# Patient Record
Sex: Male | Born: 1985 | Race: Black or African American | Hispanic: No | Marital: Married | Smoking: Current every day smoker
Health system: Southern US, Community
[De-identification: ages and names within clinical notes are randomized; demographics above are authoritative.]

## PROBLEM LIST (undated history)

## (undated) DIAGNOSIS — E119 Type 2 diabetes mellitus without complications: Secondary | ICD-10-CM

---
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
## 2007-03-26 IMAGING — CR DG ORBITS FOR FOREIGN BODY
2 series · 2 of 2 positions shown · non-contrast
Comparison: none

CLINICAL DATA: The patient has worked around metal.  Pre MRI.
 VIEWS OF THE ORBITS FOR FOREIGN BODY.
 Views of the orbits were obtained with the patient looking to the left and looking to the right.  No orbital metallic foreign body is seen.  No bony abnormality is noted.

[view not recorded (1 of 2)]
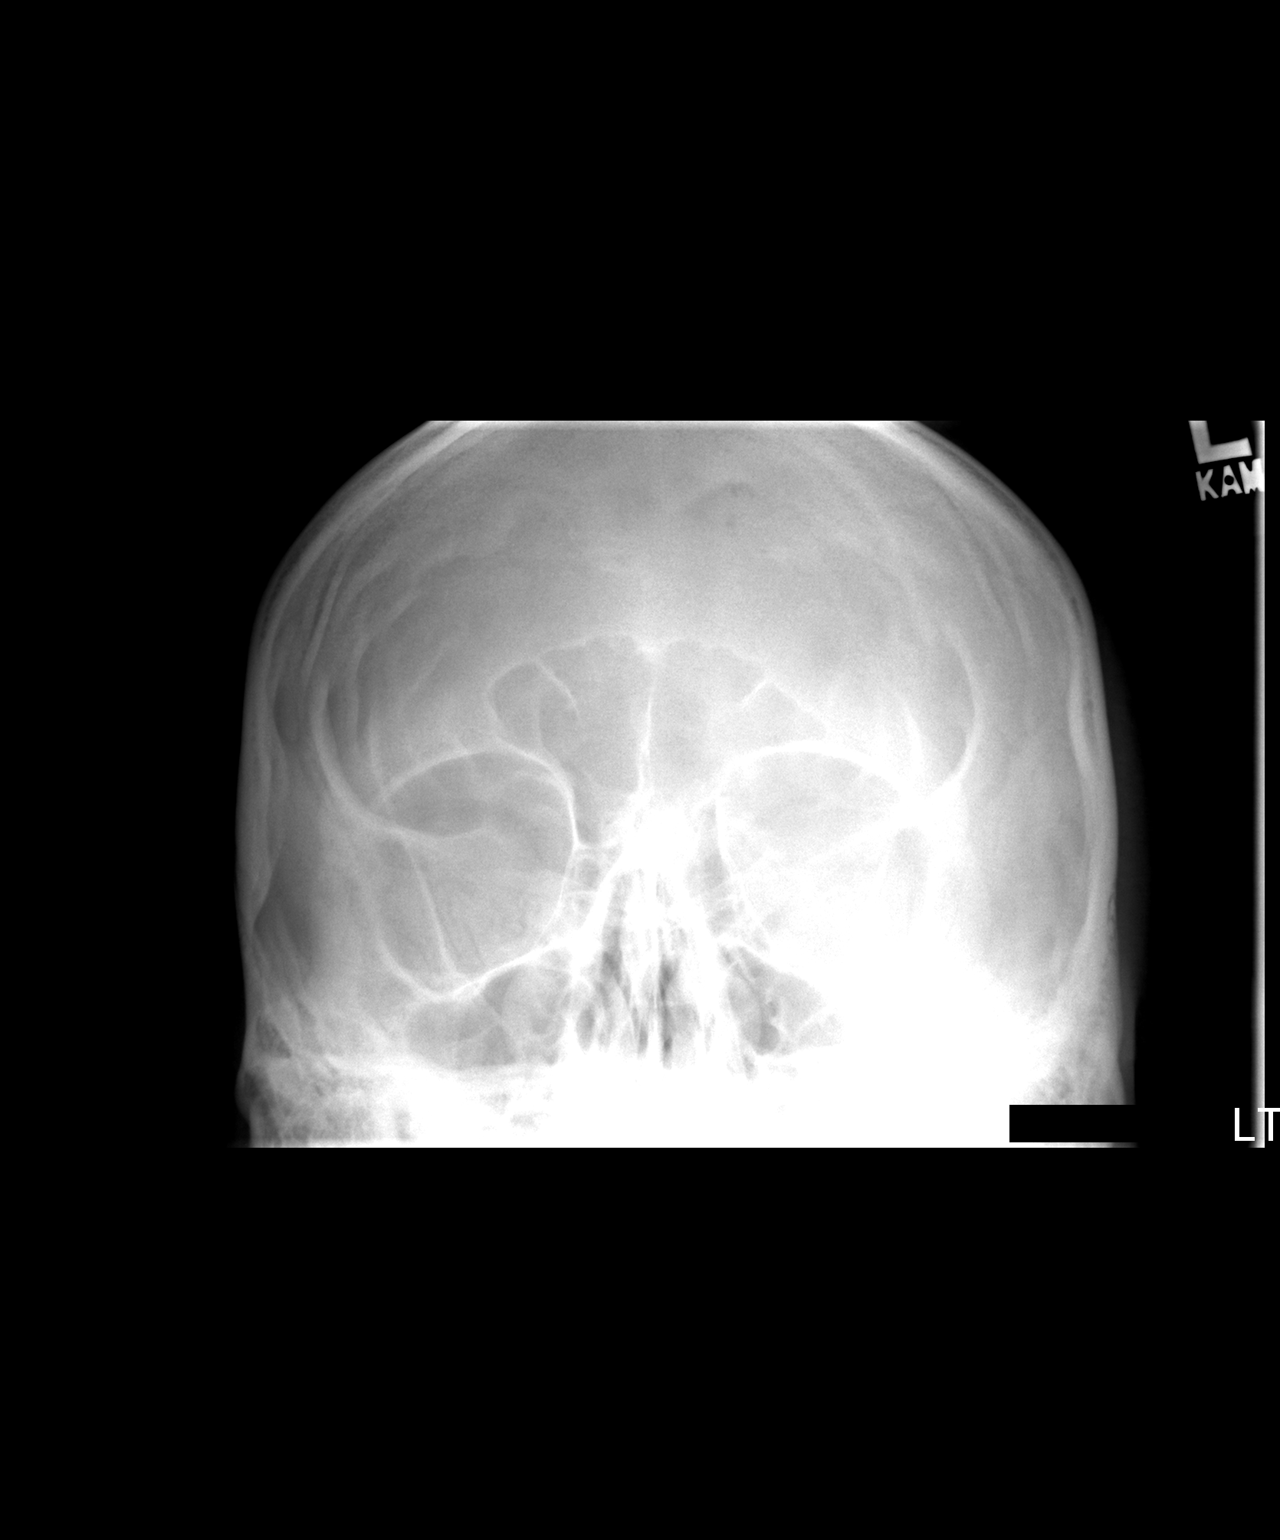

[view not recorded (2 of 2)]
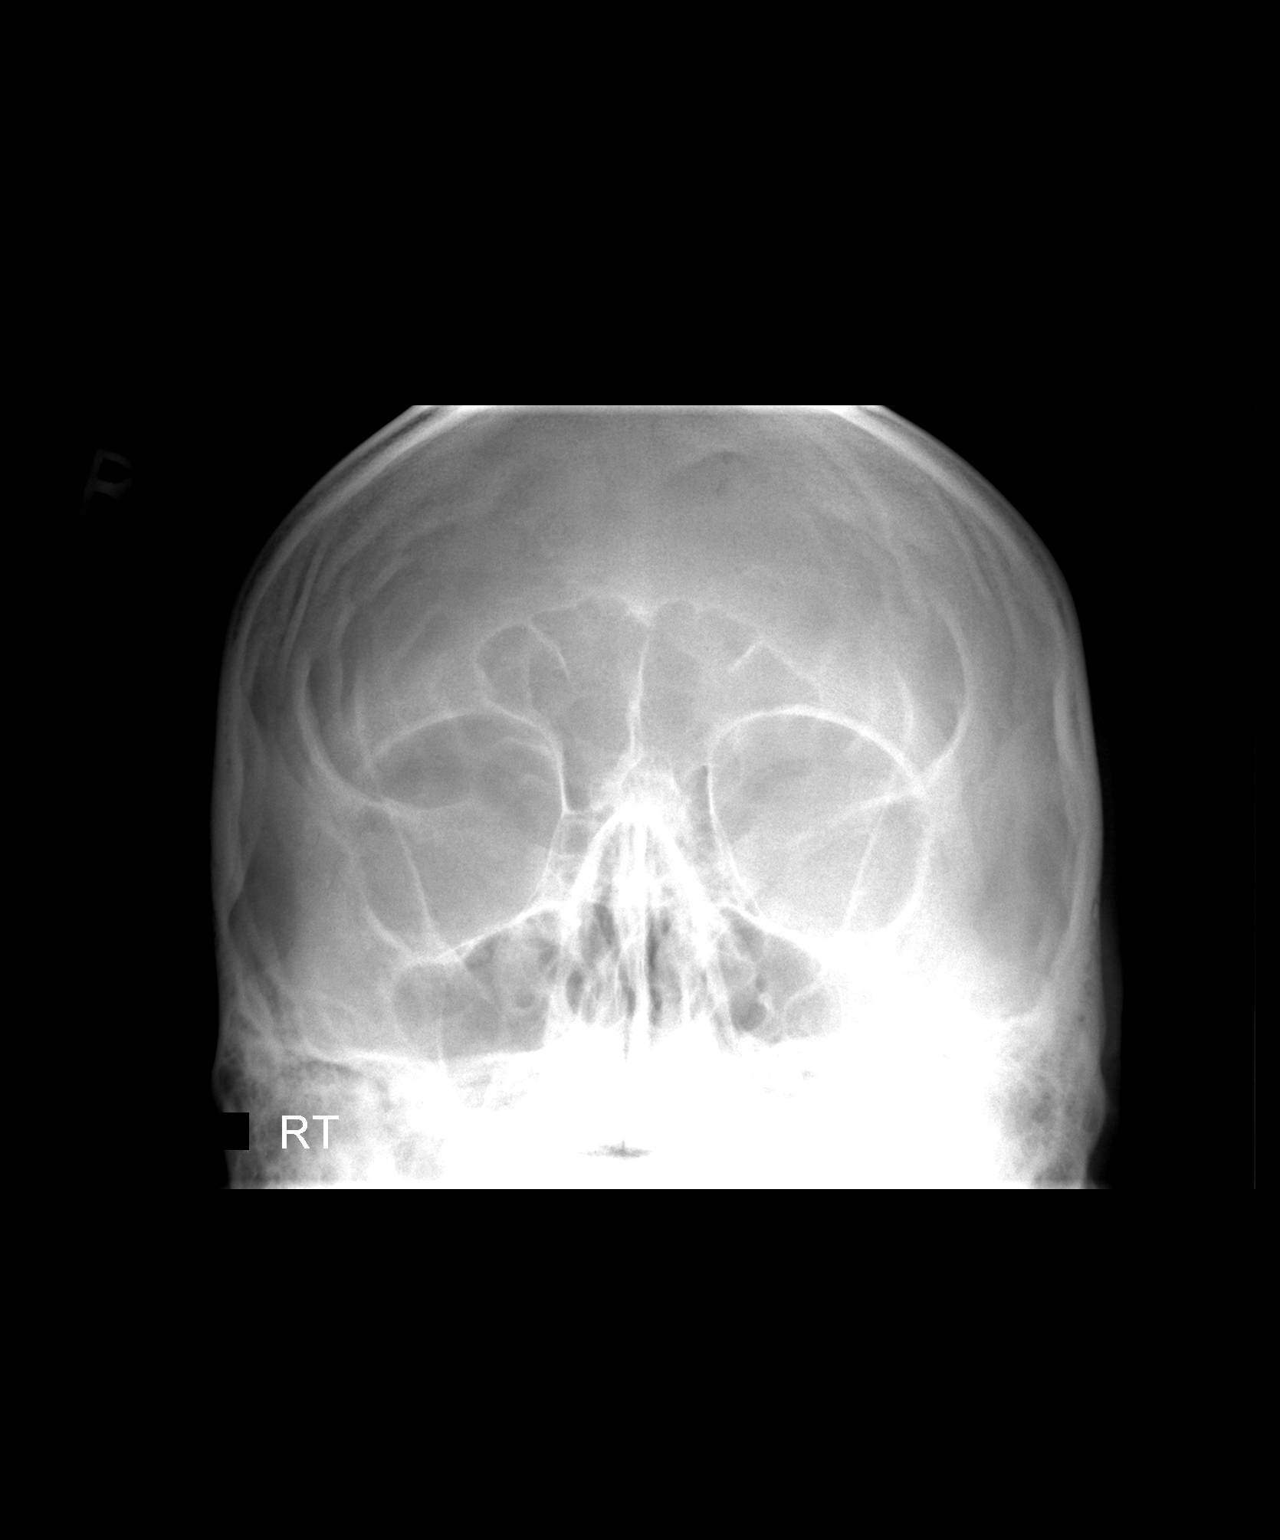

[2 of 2 positions shown; findings below may reference images not displayed]

IMPRESSION: No orbital metallic foreign body.

## 2018-11-07 ENCOUNTER — Encounter (HOSPITAL_COMMUNITY): Payer: Self-pay | Admitting: Emergency Medicine

## 2018-11-07 ENCOUNTER — Emergency Department (HOSPITAL_COMMUNITY)
Admission: EM | Admit: 2018-11-07 | Discharge: 2018-11-07 | Disposition: A | Discharge status: Home / Self Care | Payer: BC Managed Care – PPO | Attending: Emergency Medicine | Admitting: Emergency Medicine

## 2018-11-07 ENCOUNTER — Other Ambulatory Visit: Payer: Self-pay

## 2018-11-07 DIAGNOSIS — R739 Hyperglycemia, unspecified: Secondary | ICD-10-CM | POA: Diagnosis not present

## 2018-11-07 DIAGNOSIS — R531 Weakness: Secondary | ICD-10-CM | POA: Diagnosis present

## 2018-11-07 LAB — COMPREHENSIVE METABOLIC PANEL
ALT: 39 U/L (ref 0–44)
AST: 30 U/L (ref 15–41)
Albumin: 4.7 g/dL (ref 3.5–5.0)
Alkaline Phosphatase: 62 U/L (ref 38–126)
Anion gap: 17 — ABNORMAL HIGH (ref 5–15)
BUN: 22 mg/dL — ABNORMAL HIGH (ref 6–20)
CO2: 17 mmol/L — ABNORMAL LOW (ref 22–32)
Calcium: 9.8 mg/dL (ref 8.9–10.3)
Chloride: 94 mmol/L — ABNORMAL LOW (ref 98–111)
Creatinine, Ser: 1.47 mg/dL — ABNORMAL HIGH (ref 0.61–1.24)
GFR calc Af Amer: >60 mL/min (ref >60)
GFR calc non Af Amer: >60 mL/min (ref >60)
Glucose, Bld: 431 mg/dL — ABNORMAL HIGH (ref 70–99)
Potassium: 4.6 mmol/L (ref 3.5–5.1)
Sodium: 128 mmol/L — ABNORMAL LOW (ref 135–145)
Total Bilirubin: 1.4 mg/dL — ABNORMAL HIGH (ref 0.3–1.2)
Total Protein: 8.2 g/dL — ABNORMAL HIGH (ref 6.5–8.1)

## 2018-11-07 LAB — CBC WITH DIFFERENTIAL/PLATELET
Abs Immature Granulocytes: 0.02 10*3/uL (ref 0.00–0.07)
Basophils Absolute: 0 10*3/uL (ref 0.0–0.1)
Basophils Relative: 1 %
Eosinophils Absolute: 0.1 10*3/uL (ref 0.0–0.5)
Eosinophils Relative: 1 %
HCT: 47.5 % (ref 39.0–52.0)
Hemoglobin: 16.3 g/dL (ref 13.0–17.0)
Immature Granulocytes: 0 %
Lymphocytes Relative: 26 %
Lymphs Abs: 2 10*3/uL (ref 0.7–4.0)
MCH: 27.9 pg (ref 26.0–34.0)
MCHC: 34.3 g/dL (ref 30.0–36.0)
MCV: 81.2 fL (ref 80.0–100.0)
Monocytes Absolute: 0.5 10*3/uL (ref 0.1–1.0)
Monocytes Relative: 6 %
Neutro Abs: 5.3 10*3/uL (ref 1.7–7.7)
Neutrophils Relative %: 66 %
Platelets: 259 10*3/uL (ref 150–400)
RBC: 5.85 MIL/uL — ABNORMAL HIGH (ref 4.22–5.81)
RDW: 12 % (ref 11.5–15.5)
WBC: 7.9 10*3/uL (ref 4.0–10.5)
nRBC: 0 % (ref 0.0–0.2)

## 2018-11-07 LAB — URINALYSIS, ROUTINE W REFLEX MICROSCOPIC
Bacteria, UA: NONE SEEN
Bilirubin Urine: NEGATIVE
Glucose, UA: >=500 mg/dL — AB
Hgb urine dipstick: NEGATIVE
Ketones, ur: 80 mg/dL — AB
Leukocytes,Ua: NEGATIVE
Nitrite: NEGATIVE
Protein, ur: NEGATIVE mg/dL
Specific Gravity, Urine: 1.03 (ref 1.005–1.030)
pH: 5 (ref 5.0–8.0)

## 2018-11-07 LAB — CBG MONITORING, ED
Glucose-Capillary: 443 mg/dL — ABNORMAL HIGH (ref 70–99)
Glucose-Capillary: 290 mg/dL — ABNORMAL HIGH (ref 70–99)

## 2018-11-07 LAB — BASIC METABOLIC PANEL
Anion gap: 13 (ref 5–15)
BUN: 18 mg/dL (ref 6–20)
CO2: 17 mmol/L — ABNORMAL LOW (ref 22–32)
Calcium: 8.7 mg/dL — ABNORMAL LOW (ref 8.9–10.3)
Chloride: 103 mmol/L (ref 98–111)
Creatinine, Ser: 1.26 mg/dL — ABNORMAL HIGH (ref 0.61–1.24)
GFR calc Af Amer: >60 mL/min (ref >60)
GFR calc non Af Amer: >60 mL/min (ref >60)
Glucose, Bld: 274 mg/dL — ABNORMAL HIGH (ref 70–99)
Potassium: 3.9 mmol/L (ref 3.5–5.1)
Sodium: 133 mmol/L — ABNORMAL LOW (ref 135–145)

## 2018-11-07 MED ORDER — INSULIN ASPART 100 UNIT/ML ~~LOC~~ SOLN
6.0000 [IU] | Freq: Once | SUBCUTANEOUS | Status: AC
  Administered 2018-11-07: 16:00:00 6 [IU] via SUBCUTANEOUS
  Filled 2018-11-07: qty 1

## 2018-11-07 MED ORDER — SODIUM CHLORIDE 0.9 % IV BOLUS
1000.0000 mL | Freq: Once | INTRAVENOUS | Status: AC
  Administered 2018-11-07: 17:00:00 1000 mL via INTRAVENOUS

## 2018-11-07 MED ORDER — METFORMIN HCL 500 MG PO TABS: 500.0000 mg | ORAL_TABLET | Freq: Two times a day (BID) | ORAL | 0 refills | Status: DC

## 2018-11-07 MED ORDER — SODIUM CHLORIDE 0.9 % IV BOLUS
1000.0000 mL | Freq: Once | INTRAVENOUS | Status: AC
  Administered 2018-11-07: 1000 mL via INTRAVENOUS

## 2018-11-07 NOTE — Discharge Instructions (Signed)
Continue to drink plenty of water.  Take the metformin as directed twice daily.  Follow-up with your doctor next week.  Return for any worsening symptoms

## 2018-11-07 NOTE — ED Provider Notes (Signed)
[facility] EMERGENCY DEPARTMENT Provider Note   CSN: [id number] Arrival date & time: 11/07/18  1415    History   Chief Complaint Chief Complaint  Patient presents with  . Hyperglycemia    HPI Jose Bailey is a 33 y.o. male with a past medical history of pre diabetes, followed with A1C's by his pcp and low testosterone, presenting with an approximate 10 day history of generalized weakness along with increased urinary frequency without dysuria or cloudy urine and increased thirst. He denies fevers or chills, has no nausea, vomiting or abdominal pain.  He has had no recent illnesses, cough, also denies shortness of breath or chest pain.  Given his strong family history of diabetes and known prediabetic he purchased a glucose meter prior to arriving here with results of almost 500.  He has had no treatments prior to arrival.  PCP is [facility] [facility] in [place].     The history is provided by the patient.    History reviewed. No pertinent past medical history.  There are no active problems to display for this patient.   History reviewed. No pertinent surgical history.      Home Medications    Prior to Admission medications   Medication Sig Start Date End Date Taking? Authorizing Provider  cyanocobalamin (,VITAMIN B-12,) 1000 MCG/ML injection Inject 1 mL into the skin every 30 (thirty) days. 07/10/17  Yes [provider]  clomiPHENE (CLOMID) 50 MG tablet Take 1 tablet by mouth daily. 08/03/18   [provider]    Family History History reviewed. No pertinent family history.  Social History Social History   Tobacco Use  . Smoking status: Current Every Day Smoker    Types: Cigars  . Smokeless tobacco: Never Used  . Tobacco comment: monthly  Substance Use Topics  . Alcohol use: Yes    Comment: weekends  . Drug use: Never     Allergies   Penicillins   Review of Systems Review of Systems  Constitutional: Positive for fatigue. Negative for  chills and fever.  HENT: Negative for congestion and sore throat.   Eyes: Negative.   Respiratory: Negative for chest tightness and shortness of breath.   Cardiovascular: Negative for chest pain and palpitations.  Gastrointestinal: Negative for abdominal pain, nausea and vomiting.  Endocrine: Positive for polydipsia and polyuria.  Genitourinary: Negative.   Musculoskeletal: Negative for arthralgias, joint swelling and neck pain.  Skin: Negative.  Negative for rash and wound.  Neurological: Negative for dizziness, weakness, light-headedness, numbness and headaches.  Psychiatric/Behavioral: Negative.      Physical Exam Updated Vital Signs BP (!) 129/94 Comment: Simultaneous filing. User may not have seen previous data.  Pulse 81 Comment: Simultaneous filing. User may not have seen previous data.  Temp 98 F (36.7 C) (Oral)   Resp 18   Ht 5\' 8"  (1.727 m)   Wt 105.7 kg   SpO2 98% Comment: Simultaneous filing. User may not have seen previous data.  BMI 35.43 kg/m   Physical Exam Vitals signs and nursing note reviewed.  Constitutional:      Appearance: He is well-developed.  HENT:     Head: Normocephalic and atraumatic.     Mouth/Throat:     Mouth: Mucous membranes are dry.     Pharynx: Oropharynx is clear.  Eyes:     Conjunctiva/sclera: Conjunctivae normal.  Neck:     Musculoskeletal: Normal range of motion.  Cardiovascular:     Rate and Rhythm: Normal rate and regular rhythm.  Heart sounds: Normal heart sounds.  Pulmonary:     Effort: Pulmonary effort is normal.     Breath sounds: Normal breath sounds. No wheezing.  Abdominal:     General: Bowel sounds are normal. There is no distension.     Palpations: Abdomen is soft.     Tenderness: There is no abdominal tenderness. There is no guarding.  Musculoskeletal: Normal range of motion.     Right lower leg: No edema.     Left lower leg: No edema.  Skin:    General: Skin is warm and dry.  Neurological:     Mental  Status: He is alert.      ED Treatments / Results  Labs (all labs ordered are listed, but only abnormal results are displayed) Labs Reviewed  CBC WITH DIFFERENTIAL/PLATELET - Abnormal; Notable for the following components:      Result Value   RBC 5.85 (*)    All other components within normal limits  COMPREHENSIVE METABOLIC PANEL - Abnormal; Notable for the following components:   Sodium 128 (*)    Chloride 94 (*)    CO2 17 (*)    Glucose, Bld 431 (*)    BUN 22 (*)    Creatinine, Ser 1.47 (*)    Total Protein 8.2 (*)    Total Bilirubin 1.4 (*)    Anion gap 17 (*)    All other components within normal limits  URINALYSIS, ROUTINE W REFLEX MICROSCOPIC - Abnormal; Notable for the following components:   Color, Urine STRAW (*)    Glucose, UA >=500 (*)    Ketones, ur 80 (*)    All other components within normal limits  CBG MONITORING, ED - Abnormal; Notable for the following components:   Glucose-Capillary 443 (*)    All other components within normal limits    EKG None  Radiology No results found.  Procedures Procedures (including critical care time)  Medications Ordered in ED Medications  sodium chloride 0.9 % bolus 1,000 mL (has no administration in time range)  sodium chloride 0.9 % bolus 1,000 mL (1,000 mLs Intravenous New Bag/Given 11/07/18 1529)  insulin aspart (novoLOG) injection 6 Units (6 Units Subcutaneous Given 11/07/18 1536)     Initial Impression / Assessment and Plan / ED Course  I have reviewed the triage vital signs and the nursing notes.  Pertinent labs & imaging results that were available during my care of the patient were reviewed by me and considered in my medical decision making (see chart for details).        Patient who is known to be prediabetic and now with full on diabetes.  His labs are significant for a blood glucose level of 431.  He is also dry with a BUN of 22 and a creatinine of 1.47.  His corrected sodium is 133.  He was given IV  fluids along with a subcu dose of fast acting insulin 6 units.  He does have a small anion gap at 17.  Plan will be to continue hydration, recheck electrolytes including serial CBGs.  He does not clinically appear to have DKA and may be discharged home with plan to start oral hypoglycemics.  Discussed with Pauline Ausammy Triplett, PA-C who assumes care of patient.  Final Clinical Impressions(s) / ED Diagnoses   Final diagnoses:  Hyperglycemia    ED Discharge Orders    None       Victoriano Laindol, Latanza Pfefferkorn, PA-C 11/07/18 1620    Sabas SousBero, Michael M, MD 11/12/18 [phone]

## 2018-11-07 NOTE — ED Triage Notes (Signed)
Pt reports being borderline diabetic for a while now and for the past week or so has been feeling really bad. Has had to leave work twice due to fatigue. Has been very thirsty with increased urination. Went and bought a glucose meter and it was close to 500.

## 2018-11-07 NOTE — ED Provider Notes (Signed)
Patient signed out to me by Evalee Jefferson, PA-C at end of shift.  Patient is 33 year old male known to be prediabetic.  Came to the emergency department today with complaints of increased thirst, urination and generally not feeling well.  He was found to have a blood sugar of 443.  He was given subcu insulin and IV fluids.  He was signed out to me pending repeat BMP after his second liter of fluids.  On recheck, patient reports feeling much better and states he is ready to go home.  His repeat electrolytes have improved, blood sugar now 274.  He was taken Clomid but has stopped that medication earlier this week.  He has a PCP follow-up on Monday for repeat A1c.  Vitals reviewed, will start patient on oral hypoglycemic and he will follow-up with PCP on Monday.  He agrees to this plan and appears appropriate for discharge home.  Return precautions were discussed.    Labs Reviewed  CBC WITH DIFFERENTIAL/PLATELET - Abnormal; Notable for the following components:      Result Value   RBC 5.85 (*)    All other components within normal limits  COMPREHENSIVE METABOLIC PANEL - Abnormal; Notable for the following components:   Sodium 128 (*)    Chloride 94 (*)    CO2 17 (*)    Glucose, Bld 431 (*)    BUN 22 (*)    Creatinine, Ser 1.47 (*)    Total Protein 8.2 (*)    Total Bilirubin 1.4 (*)    Anion gap 17 (*)    All other components within normal limits  URINALYSIS, ROUTINE W REFLEX MICROSCOPIC - Abnormal; Notable for the following components:   Color, Urine STRAW (*)    Glucose, UA >=500 (*)    Ketones, ur 80 (*)    All other components within normal limits  BASIC METABOLIC PANEL - Abnormal; Notable for the following components:   Sodium 133 (*)    CO2 17 (*)    Glucose, Bld 274 (*)    Creatinine, Ser 1.26 (*)    Calcium 8.7 (*)    All other components within normal limits  CBG MONITORING, ED - Abnormal; Notable for the following components:   Glucose-Capillary 443 (*)    All other  components within normal limits  CBG MONITORING, ED - Abnormal; Notable for the following components:   Glucose-Capillary 290 (*)    All other components within normal limits      Kem Parkinson, PA-C 11/07/18 2009    Varney Biles, MD 11/10/18 2001

## 2018-11-11 ENCOUNTER — Encounter (HOSPITAL_COMMUNITY): Payer: Self-pay | Admitting: Emergency Medicine

## 2018-11-11 ENCOUNTER — Other Ambulatory Visit: Payer: Self-pay

## 2018-11-11 ENCOUNTER — Inpatient Hospital Stay (HOSPITAL_COMMUNITY)
Admission: EM | Admit: 2018-11-11 | Discharge: 2018-11-13 | DRG: 638 | Disposition: A | Discharge status: Home / Self Care | Payer: BC Managed Care – PPO | Attending: Family Medicine | Admitting: Family Medicine

## 2018-11-11 DIAGNOSIS — E1122 Type 2 diabetes mellitus with diabetic chronic kidney disease: Secondary | ICD-10-CM | POA: Diagnosis present

## 2018-11-11 DIAGNOSIS — E871 Hypo-osmolality and hyponatremia: Secondary | ICD-10-CM | POA: Diagnosis present

## 2018-11-11 DIAGNOSIS — Z880 Allergy status to penicillin: Secondary | ICD-10-CM

## 2018-11-11 DIAGNOSIS — N181 Chronic kidney disease, stage 1: Secondary | ICD-10-CM | POA: Diagnosis not present

## 2018-11-11 DIAGNOSIS — Z20828 Contact with and (suspected) exposure to other viral communicable diseases: Secondary | ICD-10-CM | POA: Diagnosis present

## 2018-11-11 DIAGNOSIS — E1010 Type 1 diabetes mellitus with ketoacidosis without coma: Secondary | ICD-10-CM | POA: Diagnosis not present

## 2018-11-11 DIAGNOSIS — N179 Acute kidney failure, unspecified: Secondary | ICD-10-CM | POA: Diagnosis present

## 2018-11-11 DIAGNOSIS — K219 Gastro-esophageal reflux disease without esophagitis: Secondary | ICD-10-CM | POA: Diagnosis present

## 2018-11-11 DIAGNOSIS — E0810 Diabetes mellitus due to underlying condition with ketoacidosis without coma: Secondary | ICD-10-CM

## 2018-11-11 DIAGNOSIS — E872 Acidosis, unspecified: Secondary | ICD-10-CM | POA: Diagnosis present

## 2018-11-11 DIAGNOSIS — F17290 Nicotine dependence, other tobacco product, uncomplicated: Secondary | ICD-10-CM | POA: Diagnosis present

## 2018-11-11 DIAGNOSIS — E860 Dehydration: Secondary | ICD-10-CM | POA: Diagnosis present

## 2018-11-11 DIAGNOSIS — E1310 Other specified diabetes mellitus with ketoacidosis without coma: Secondary | ICD-10-CM | POA: Diagnosis not present

## 2018-11-11 DIAGNOSIS — E291 Testicular hypofunction: Secondary | ICD-10-CM | POA: Diagnosis not present

## 2018-11-11 DIAGNOSIS — E1110 Type 2 diabetes mellitus with ketoacidosis without coma: Secondary | ICD-10-CM | POA: Diagnosis not present

## 2018-11-11 DIAGNOSIS — Z7984 Long term (current) use of oral hypoglycemic drugs: Secondary | ICD-10-CM | POA: Diagnosis not present

## 2018-11-11 HISTORY — DX: Type 2 diabetes mellitus without complications: E11.9

## 2018-11-11 LAB — BASIC METABOLIC PANEL
Anion gap: 18 — ABNORMAL HIGH (ref 5–15)
Anion gap: 13 (ref 5–15)
BUN: 18 mg/dL (ref 6–20)
BUN: 15 mg/dL (ref 6–20)
CO2: 7 mmol/L — ABNORMAL LOW (ref 22–32)
CO2: 10 mmol/L — ABNORMAL LOW (ref 22–32)
Calcium: 8.5 mg/dL — ABNORMAL LOW (ref 8.9–10.3)
Calcium: 8.4 mg/dL — ABNORMAL LOW (ref 8.9–10.3)
Chloride: 104 mmol/L (ref 98–111)
Chloride: 106 mmol/L (ref 98–111)
Creatinine, Ser: 1.7 mg/dL — ABNORMAL HIGH (ref 0.61–1.24)
Creatinine, Ser: 1.45 mg/dL — ABNORMAL HIGH (ref 0.61–1.24)
GFR calc Af Amer: >60 mL/min (ref >60)
GFR calc Af Amer: >60 mL/min (ref >60)
GFR calc non Af Amer: 53 mL/min — ABNORMAL LOW (ref >60)
GFR calc non Af Amer: >60 mL/min (ref >60)
Glucose, Bld: 307 mg/dL — ABNORMAL HIGH (ref 70–99)
Glucose, Bld: 174 mg/dL — ABNORMAL HIGH (ref 70–99)
Potassium: 5.3 mmol/L — ABNORMAL HIGH (ref 3.5–5.1)
Potassium: 4.2 mmol/L (ref 3.5–5.1)
Sodium: 129 mmol/L — ABNORMAL LOW (ref 135–145)
Sodium: 129 mmol/L — ABNORMAL LOW (ref 135–145)

## 2018-11-11 LAB — URINALYSIS, ROUTINE W REFLEX MICROSCOPIC
Bacteria, UA: NONE SEEN
Bilirubin Urine: NEGATIVE
Glucose, UA: >=500 mg/dL — AB
Ketones, ur: 80 mg/dL — AB
Leukocytes,Ua: NEGATIVE
Nitrite: NEGATIVE
Protein, ur: 100 mg/dL — AB
Specific Gravity, Urine: 1.022 (ref 1.005–1.030)
pH: 5 (ref 5.0–8.0)

## 2018-11-11 LAB — COMPREHENSIVE METABOLIC PANEL
ALT: 32 U/L (ref 0–44)
AST: 20 U/L (ref 15–41)
Albumin: 5.2 g/dL — ABNORMAL HIGH (ref 3.5–5.0)
Alkaline Phosphatase: 71 U/L (ref 38–126)
Anion gap: >20 — ABNORMAL HIGH (ref 5–15)
BUN: 18 mg/dL (ref 6–20)
CO2: 8 mmol/L — ABNORMAL LOW (ref 22–32)
Calcium: 9.6 mg/dL (ref 8.9–10.3)
Chloride: 97 mmol/L — ABNORMAL LOW (ref 98–111)
Creatinine, Ser: 1.84 mg/dL — ABNORMAL HIGH (ref 0.61–1.24)
GFR calc Af Amer: 55 mL/min — ABNORMAL LOW (ref >60)
GFR calc non Af Amer: 48 mL/min — ABNORMAL LOW (ref >60)
Glucose, Bld: 397 mg/dL — ABNORMAL HIGH (ref 70–99)
Potassium: 5.3 mmol/L — ABNORMAL HIGH (ref 3.5–5.1)
Sodium: 128 mmol/L — ABNORMAL LOW (ref 135–145)
Total Bilirubin: 1.4 mg/dL — ABNORMAL HIGH (ref 0.3–1.2)
Total Protein: 9.1 g/dL — ABNORMAL HIGH (ref 6.5–8.1)

## 2018-11-11 LAB — CBC WITH DIFFERENTIAL/PLATELET
Abs Immature Granulocytes: 0.05 10*3/uL (ref 0.00–0.07)
Basophils Absolute: 0 10*3/uL (ref 0.0–0.1)
Basophils Relative: 0 %
Eosinophils Absolute: 0 10*3/uL (ref 0.0–0.5)
Eosinophils Relative: 0 %
HCT: 54.5 % — ABNORMAL HIGH (ref 39.0–52.0)
Hemoglobin: 17.6 g/dL — ABNORMAL HIGH (ref 13.0–17.0)
Immature Granulocytes: 1 %
Lymphocytes Relative: 13 %
Lymphs Abs: 1.3 10*3/uL (ref 0.7–4.0)
MCH: 27.2 pg (ref 26.0–34.0)
MCHC: 32.3 g/dL (ref 30.0–36.0)
MCV: 84.1 fL (ref 80.0–100.0)
Monocytes Absolute: 0.6 10*3/uL (ref 0.1–1.0)
Monocytes Relative: 6 %
Neutro Abs: 8 10*3/uL — ABNORMAL HIGH (ref 1.7–7.7)
Neutrophils Relative %: 80 %
Platelets: 292 10*3/uL (ref 150–400)
RBC: 6.48 MIL/uL — ABNORMAL HIGH (ref 4.22–5.81)
RDW: 12.5 % (ref 11.5–15.5)
WBC: 9.9 10*3/uL (ref 4.0–10.5)
nRBC: 0 % (ref 0.0–0.2)

## 2018-11-11 LAB — BLOOD GAS, VENOUS
Acid-base deficit: 17.8 mmol/L — ABNORMAL HIGH (ref 0.0–2.0)
Bicarbonate: 10.4 mmol/L — ABNORMAL LOW (ref 20.0–28.0)
Drawn by: 4437
FIO2: 21
O2 Saturation: 49.2 %
Patient temperature: 37
pCO2, Ven: 30.1 mmHg — ABNORMAL LOW (ref 44.0–60.0)
pH, Ven: 7.13 — CL (ref 7.250–7.430)

## 2018-11-11 LAB — CBG MONITORING, ED
Glucose-Capillary: 379 mg/dL — ABNORMAL HIGH (ref 70–99)
Glucose-Capillary: 417 mg/dL — ABNORMAL HIGH (ref 70–99)
Glucose-Capillary: 367 mg/dL — ABNORMAL HIGH (ref 70–99)
Glucose-Capillary: 295 mg/dL — ABNORMAL HIGH (ref 70–99)
Glucose-Capillary: 244 mg/dL — ABNORMAL HIGH (ref 70–99)
Glucose-Capillary: 204 mg/dL — ABNORMAL HIGH (ref 70–99)
Glucose-Capillary: 175 mg/dL — ABNORMAL HIGH (ref 70–99)
Glucose-Capillary: 139 mg/dL — ABNORMAL HIGH (ref 70–99)
Glucose-Capillary: 147 mg/dL — ABNORMAL HIGH (ref 70–99)

## 2018-11-11 LAB — MAGNESIUM: Magnesium: 2.2 mg/dL (ref 1.7–2.4)

## 2018-11-11 LAB — LACTIC ACID, PLASMA
Lactic Acid, Venous: 2.1 mmol/L (ref 0.5–1.9)
Lactic Acid, Venous: 1.4 mmol/L (ref 0.5–1.9)

## 2018-11-11 LAB — SARS CORONAVIRUS 2 BY RT PCR (HOSPITAL ORDER, PERFORMED IN ~~LOC~~ HOSPITAL LAB): SARS Coronavirus 2: NEGATIVE

## 2018-11-11 MED ORDER — SODIUM CHLORIDE 0.9 % IV SOLN: INTRAVENOUS | Status: DC

## 2018-11-11 MED ORDER — DEXTROSE 50 % IV SOLN: 25.0000 mL | INTRAVENOUS | Status: DC | PRN

## 2018-11-11 MED ORDER — INSULIN REGULAR(HUMAN) IN NACL 100-0.9 UT/100ML-% IV SOLN
INTRAVENOUS | Status: DC
  Administered 2018-11-12: 4.4 [IU]/h via INTRAVENOUS
  Filled 2018-11-11: qty 100

## 2018-11-11 MED ORDER — ACETAMINOPHEN 325 MG PO TABS: 650.0000 mg | ORAL_TABLET | Freq: Four times a day (QID) | ORAL | Status: DC | PRN

## 2018-11-11 MED ORDER — DEXTROSE-NACL 5-0.45 % IV SOLN
INTRAVENOUS | Status: DC
  Administered 2018-11-11: 19:00:00 via INTRAVENOUS

## 2018-11-11 MED ORDER — SODIUM CHLORIDE 0.9 % IV BOLUS
1000.0000 mL | Freq: Once | INTRAVENOUS | Status: AC
  Administered 2018-11-11: 16:00:00 1000 mL via INTRAVENOUS

## 2018-11-11 MED ORDER — ENOXAPARIN SODIUM 40 MG/0.4ML ~~LOC~~ SOLN
40.0000 mg | SUBCUTANEOUS | Status: DC
  Administered 2018-11-11 – 2018-11-12 (×2): 40 mg via SUBCUTANEOUS
  Filled 2018-11-11 (×2): qty 0.4

## 2018-11-11 MED ORDER — SODIUM BICARBONATE 8.4 % IV SOLN
INTRAVENOUS | Status: AC
  Filled 2018-11-11: qty 100

## 2018-11-11 MED ORDER — INSULIN REGULAR BOLUS VIA INFUSION
0.0000 [IU] | Freq: Three times a day (TID) | INTRAVENOUS | Status: DC
  Filled 2018-11-11: qty 10

## 2018-11-11 MED ORDER — INSULIN REGULAR(HUMAN) IN NACL 100-0.9 UT/100ML-% IV SOLN
INTRAVENOUS | Status: DC
  Administered 2018-11-11: 3.6 [IU]/h via INTRAVENOUS
  Filled 2018-11-11: qty 100

## 2018-11-11 MED ORDER — DEXTROSE-NACL 5-0.45 % IV SOLN: INTRAVENOUS | Status: DC

## 2018-11-11 MED ORDER — SODIUM BICARBONATE 8.4 % IV SOLN
INTRAVENOUS | Status: DC
  Administered 2018-11-11: 23:00:00 via INTRAVENOUS
  Filled 2018-11-11 (×5): qty 100

## 2018-11-11 MED ORDER — ONDANSETRON HCL 4 MG/2ML IJ SOLN
4.0000 mg | Freq: Four times a day (QID) | INTRAMUSCULAR | Status: DC | PRN
  Administered 2018-11-11: 21:00:00 4 mg via INTRAVENOUS
  Filled 2018-11-11: qty 2

## 2018-11-11 MED ORDER — SODIUM CHLORIDE 0.9 % IV SOLN
INTRAVENOUS | Status: AC
  Administered 2018-11-11: 20:00:00 via INTRAVENOUS

## 2018-11-11 MED ORDER — CALCIUM CARBONATE ANTACID 500 MG PO CHEW
1.0000 | CHEWABLE_TABLET | Freq: Two times a day (BID) | ORAL | Status: DC | PRN
  Administered 2018-11-11 – 2018-11-12 (×2): 200 mg via ORAL
  Filled 2018-11-11 (×2): qty 1

## 2018-11-11 NOTE — ED Provider Notes (Signed)
[facility] EMERGENCY DEPARTMENT Provider Note   CSN: [id number] Arrival date & time: 11/11/18  1050     History   Chief Complaint No chief complaint on file.   HPI Jose Bailey is a 33 y.o. male.     Patient states she has been vomiting for 4 days now.   Patient was started on metformin for newly diagnosed diabetes on Saturday.  The history is provided by the patient. No language interpreter was used.  Emesis Severity:  Moderate Timing:  Constant Quality:  Bilious material Able to tolerate:  Liquids Progression:  Worsening Chronicity:  New Recent urination:  Normal Associated symptoms: no abdominal pain, no cough, no diarrhea and no headaches     Past Medical History:  Diagnosis Date  . Diabetes mellitus without complication ([place])     There are no active problems to display for this patient.   History reviewed. No pertinent surgical history.      Home Medications    Prior to Admission medications   Medication Sig Start Date End Date Taking? Authorizing Provider  clomiPHENE (CLOMID) 50 MG tablet Take 1 tablet by mouth daily. 08/03/18   [provider]  cyanocobalamin (,VITAMIN B-12,) 1000 MCG/ML injection Inject 1 mL into the skin every 30 (thirty) days. 07/10/17   [provider]  metFORMIN (GLUCOPHAGE) 500 MG tablet Take 1 tablet (500 mg total) by mouth 2 (two) times daily with a meal. 11/07/18   Triplett, Tammy, PA-C    Family History No family history on file.  Social History Social History   Tobacco Use  . Smoking status: Current Every Day Smoker    Types: Cigars  . Smokeless tobacco: Never Used  . Tobacco comment: monthly  Substance Use Topics  . Alcohol use: Yes    Comment: weekends  . Drug use: Never     Allergies   Penicillins   Review of Systems Review of Systems  Constitutional: Negative for appetite change and fatigue.  HENT: Negative for congestion, ear discharge and sinus pressure.   Eyes: Negative for  discharge.  Respiratory: Negative for cough.   Cardiovascular: Negative for chest pain.  Gastrointestinal: Positive for vomiting. Negative for abdominal pain and diarrhea.  Genitourinary: Negative for frequency and hematuria.  Musculoskeletal: Negative for back pain.  Skin: Negative for rash.  Neurological: Negative for seizures and headaches.  Psychiatric/Behavioral: Negative for hallucinations.     Physical Exam Updated Vital Signs BP (!) 113/58 (BP Location: Right Arm)   Pulse (!) 117   Temp 97.9 F (36.6 C) (Oral)   Resp 20   Ht 5\' 8"  (1.727 m)   Wt 106.6 kg   SpO2 100%   BMI 35.73 kg/m   Physical Exam Vitals signs and nursing note reviewed.  Constitutional:      Appearance: He is well-developed.  HENT:     Head: Normocephalic.     Nose: Nose normal.  Eyes:     General: No scleral icterus.    Conjunctiva/sclera: Conjunctivae normal.  Neck:     Musculoskeletal: Neck supple.     Thyroid: No thyromegaly.  Cardiovascular:     Rate and Rhythm: Normal rate and regular rhythm.     Heart sounds: No murmur. No friction rub. No gallop.   Pulmonary:     Breath sounds: No stridor. No wheezing or rales.  Chest:     Chest wall: No tenderness.  Abdominal:     General: There is no distension.     Tenderness: There  is no abdominal tenderness. There is no rebound.  Musculoskeletal: Normal range of motion.  Lymphadenopathy:     Cervical: No cervical adenopathy.  Skin:    Findings: No erythema or rash.  Neurological:     Mental Status: He is oriented to person, place, and time.     Motor: No abnormal muscle tone.     Coordination: Coordination normal.  Psychiatric:        Behavior: Behavior normal.      ED Treatments / Results  Labs (all labs ordered are listed, but only abnormal results are displayed) Labs Reviewed  COMPREHENSIVE METABOLIC PANEL - Abnormal; Notable for the following components:      Result Value   Sodium 128 (*)    Potassium 5.3 (*)    Chloride  97 (*)    CO2 8 (*)    Glucose, Bld 397 (*)    Creatinine, Ser 1.84 (*)    Total Protein 9.1 (*)    Albumin 5.2 (*)    Total Bilirubin 1.4 (*)    GFR calc non Af Amer 48 (*)    GFR calc Af Amer 55 (*)    Anion gap >20 (*)    All other components within normal limits  CBC WITH DIFFERENTIAL/PLATELET - Abnormal; Notable for the following components:   RBC 6.48 (*)    Hemoglobin 17.6 (*)    HCT 54.5 (*)    Neutro Abs 8.0 (*)    All other components within normal limits  CBG MONITORING, ED - Abnormal; Notable for the following components:   Glucose-Capillary 379 (*)    All other components within normal limits  SARS CORONAVIRUS 2 (HOSPITAL ORDER, PERFORMED IN [facility])  URINALYSIS, ROUTINE W REFLEX MICROSCOPIC  CBG MONITORING, ED    EKG None  Radiology No results found.  Procedures Procedures (including critical care time)  Medications Ordered in ED Medications  sodium chloride 0.9 % bolus 1,000 mL (has no administration in time range)  sodium chloride 0.9 % bolus 1,000 mL (has no administration in time range)  dextrose 5 %-0.45 % sodium chloride infusion (has no administration in time range)  insulin regular bolus via infusion 0-10 Units (has no administration in time range)  insulin regular, human (MYXREDLIN) 100 units/ 100 mL infusion (has no administration in time range)  dextrose 50 % solution 25 mL (has no administration in time range)  0.9 %  sodium chloride infusion (has no administration in time range)     Initial Impression / Assessment and Plan / ED Course  I have reviewed the triage vital signs and the nursing notes.  Pertinent labs & imaging results that were available during my care of the patient were reviewed by me and considered in my medical decision making (see chart for details).    CRITICAL CARE Performed by: Bethann BerkshireJoseph Tyyne Cliett Total critical care time: 45 minutes Critical care time was exclusive of separately billable procedures and  treating other patients. Critical care was necessary to treat or prevent imminent or life-threatening deterioration. Critical care was time spent personally by me on the following activities: development of treatment plan with patient and/or surrogate as well as nursing, discussions with consultants, evaluation of patient's response to treatment, examination of patient, obtaining history from patient or surrogate, ordering and performing treatments and interventions, ordering and review of laboratory studies, ordering and review of radiographic studies, pulse oximetry and re-evaluation of patient's condition.      Patient with new onset diabetes and in  DKA.  He will be started insulin drip and admitted to medicine  Final Clinical Impressions(s) / ED Diagnoses   Final diagnoses:  Diabetic ketoacidosis without coma associated with diabetes mellitus due to underlying condition [facility](HCC)    ED Discharge Orders    None       Bethann BerkshireZammit, Isyss Espinal, MD 11/11/18 [phone]

## 2018-11-11 NOTE — ED Notes (Signed)
Pt voided,  525 ml. drinking water.

## 2018-11-11 NOTE — H&P (Addendum)
History and Physical    Jose Bailey ZOX:[id number] DOB: 03/20/1986 DOA: 11/11/2018  PCP: Medicine, [facility] Jose Bailey   Patient coming from: home  I have personally briefly reviewed patient's [facility]  Chief Complaint: Vomiting, constipation  HPI: Jose Bailey is a 33 y.o. male with medical history significant for newly diagnosed diabetes mellitus, presented to the ED with complaints of vomiting, nausea and poor p.o. intake for 2 days.  Patient was seen in the ED-11/07/2018 for complaints of increased thirst urination and not feeling well, blood glucose was 443, insulin given and fluids patient was discharged home with a prescription for metformin.  Patient took 4 doses of metformin but after the second dose developed symptoms of vomiting and nausea.  Also reports constipation, last bowel movement was 2 days ago.  He has had about 3 episodes of vomiting daily.  No abdominal pain.  He denies difficulty breathing or cough, tells me when he came in he was not actually short of breath he just felt very dehydrated and weak. He also reports pain with urination that started yesterday.  ED Course: Tachycardia 117.  Blood pressure 113/58.  Anion gap of greater than 20 with serum bicarb of 8, sodium 138, potassium 5.3.  Creatinine 1.8, random glucose 397.  WBC 9.9 with hemoglobin of 17.6.  UA pending.  Patient started on insulin GTT.  Hospitalist to admit for DKA.  Review of Systems: As per HPI all other systems reviewed and negative.  Past Medical History:  Diagnosis Date  . Diabetes mellitus without complication (HCC)     History reviewed. No pertinent surgical history.   reports that he has been smoking cigars. He has never used smokeless tobacco. He reports current alcohol use. He reports that he does not use drugs.  Allergies  Allergen Reactions  . Penicillins Hives    .Did it involve swelling of the face/tongue/throat, SOB, or low  BP? No Did it involve sudden or severe rash/hives, skin peeling, or any reaction on the inside of your mouth or nose? Yes Did you need to seek medical attention at a hospital or doctor's office? Yes When did it last happen?Childhood If all above answers are "NO", may proceed with cephalosporin use.     Bailey history of diabetes and hypertension in grandparents.  Prior to Admission medications   Medication Sig Start Date End Date Taking? Authorizing Provider  cyanocobalamin (,VITAMIN B-12,) 1000 MCG/ML injection Inject 1 mL into the skin every 30 (thirty) days. 07/10/17  Yes [provider]  metFORMIN (GLUCOPHAGE) 500 MG tablet Take 1 tablet (500 mg total) by mouth 2 (two) times daily with a meal. 11/07/18  Yes Pauline Ausriplett, Tammy, PA-C    Physical Exam: Vitals:   11/11/18 1127 11/11/18 1128  BP: (!) 113/58   Pulse: (!) 117   Resp: 20   Temp: 97.9 F (36.6 C)   TempSrc: Oral   SpO2: 100%   Weight:  106.6 kg  Height:  5\' 8"  (1.727 m)    Constitutional: mildly shivering,  Vitals:   11/11/18 1127 11/11/18 1128  BP: (!) 113/58   Pulse: (!) 117   Resp: 20   Temp: 97.9 F (36.6 C)   TempSrc: Oral   SpO2: 100%   Weight:  106.6 kg  Height:  5\' 8"  (1.727 m)   Eyes: PERRL, lids and conjunctivae normal ENMT: Mucous membranes are dry. Posterior pharynx clear of any exudate or lesions. Neck: normal, supple, no masses,  no thyromegaly Respiratory: clear to auscultation bilaterally, no wheezing, no crackles. Normal respiratory effort. No accessory muscle use.  Cardiovascular: Regular rate and rhythm, no murmurs / rubs / gallops. No extremity edema. 2+ pedal pulses.  Abdomen: no tenderness, no masses palpated. No hepatosplenomegaly. Bowel sounds positive.  Musculoskeletal: no clubbing / cyanosis. No joint deformity upper and lower extremities. Good ROM, no contractures. Normal muscle tone.  Skin: no rashes, lesions, ulcers. No induration Neurologic: CN 2-12 grossly intact.  Strength 5/5 in all 4.  Psychiatric: Normal judgment and insight. Alert and oriented x 3. Normal mood.   Labs on Admission: I have personally reviewed following labs and imaging studies  CBC: Recent Labs  Lab 11/07/18 1526 11/11/18 1213  WBC 7.9 9.9  NEUTROABS 5.3 8.0*  HGB 16.3 17.6*  HCT 47.5 54.5*  MCV 81.2 84.1  PLT 259 409   Basic Metabolic Panel: Recent Labs  Lab 11/07/18 1526 11/07/18 1855 11/11/18 1213  NA 128* 133* 128*  K 4.6 3.9 5.3*  CL 94* 103 97*  CO2 17* 17* 8*  GLUCOSE 431* 274* 397*  BUN 22* 18 18  CREATININE 1.47* 1.26* 1.84*  CALCIUM 9.8 8.7* 9.6   Liver Function Tests: Recent Labs  Lab 11/07/18 1526 11/11/18 1213  AST 30 20  ALT 39 32  ALKPHOS 62 71  BILITOT 1.4* 1.4*  PROT 8.2* 9.1*  ALBUMIN 4.7 5.2*   CBG: Recent Labs  Lab 11/07/18 1428 11/07/18 1746 11/11/18 1130 11/11/18 1611  GLUCAP 443* 290* 379* 417*    Radiological Exams on Admission: No results found.  EKG: None.  Assessment/Plan Active Problems:   DKA (diabetic ketoacidoses) (HCC)    Diabetic ketoacidosis- newly diagnosed diabetic.  Recently started on metformin.  Random glucose 397, with bicarb of 8 and > 20 anion gap.  Patient likely was in early DKA, causing nausea & vomiting 2/2, doubt metformin contributed to symptoms.  Patient reported constipation, metformin known to cause diarrhea.  Reports dysuria.  2 L bolus given in ED. -Continue insulin GTT, DKA protocol -BMP every 4 hourly x4 - HGBA1c -Hold home metformin - N/s 150cc/hr -Repeat 1 L bolus, totalling 3L  -Add on venous blood gas, Ph 7.1. - F/u UA - Addendum, With persistent metabolic acidosis with low serum Bicarb- now 7, will change fluids to Bicarb+ D5.   Acute kidney injury-creatinine 1.8, baseline 1.2-1.4.  With hemoglobin of 17.6 suggesting hemoconcentration.  AKI likely secondary to dehydration from vomiting and poor p.o. intake in the setting of DKA. -Hydrate - Follow BMP   Anion gap  metabolic acidosis-serum bicarb 8, anion gap of 20.  Likely secondary to DKA and acute kidney injury. - Check lactic acid -2.1, likely from dehydration. Trend.  Low testosterone-on Clomid- 1- 2 years.  Has not taken for about a week ago for concerns of diabetes.  Per up-to-date- Clomid not known to cause diabetes.  SARS-CoV-2 test and HIV test pending.  DVT prophylaxis: Lovenox Code Status: FUll Bailey Communication: Spouse at bedside Disposition Plan: Per rounding team Consults called: None Admission status: Inpatient, Step down I certify that at the point of admission it is my clinical judgment that the patient will require inpatient hospital care spanning beyond 2 midnights from the point of admission due to high intensity of service, high risk for further deterioration and high frequency of surveillance required. The following factors support the patient status of inpatient: DKA requiring insulin gtt and step down level of care.   Bethena Roys MD [facility]  11/11/2018, 6:17 PM

## 2018-11-11 NOTE — ED Triage Notes (Signed)
Pt states that he was put on metformin sat for a new dx of diabetes since then he as got constipated sob and having nausea and vomiting. He has not been eating for the past two days.

## 2018-11-11 NOTE — ED Notes (Signed)
Voided 600 ml yellow urine, sample taken to lab. Place Patient in a gown, bed changed, warm blankets, pt is shivering.  Refilled water cup.

## 2018-11-11 NOTE — ED Notes (Signed)
Patient states that he is not in any pain at this time. Feels like acid reflux in throat area. Patient sleepy at this time.

## 2018-11-12 DIAGNOSIS — E872 Acidosis, unspecified: Secondary | ICD-10-CM | POA: Diagnosis present

## 2018-11-12 DIAGNOSIS — E291 Testicular hypofunction: Secondary | ICD-10-CM | POA: Diagnosis present

## 2018-11-12 DIAGNOSIS — E871 Hypo-osmolality and hyponatremia: Secondary | ICD-10-CM | POA: Diagnosis present

## 2018-11-12 DIAGNOSIS — N181 Chronic kidney disease, stage 1: Secondary | ICD-10-CM | POA: Diagnosis present

## 2018-11-12 DIAGNOSIS — E1010 Type 1 diabetes mellitus with ketoacidosis without coma: Secondary | ICD-10-CM

## 2018-11-12 LAB — BASIC METABOLIC PANEL
Anion gap: 10 (ref 5–15)
Anion gap: 10 (ref 5–15)
BUN: 13 mg/dL (ref 6–20)
BUN: 13 mg/dL (ref 6–20)
CO2: 14 mmol/L — ABNORMAL LOW (ref 22–32)
CO2: 16 mmol/L — ABNORMAL LOW (ref 22–32)
Calcium: 8.8 mg/dL — ABNORMAL LOW (ref 8.9–10.3)
Calcium: 8.8 mg/dL — ABNORMAL LOW (ref 8.9–10.3)
Chloride: 106 mmol/L (ref 98–111)
Chloride: 104 mmol/L (ref 98–111)
Creatinine, Ser: 1.43 mg/dL — ABNORMAL HIGH (ref 0.61–1.24)
Creatinine, Ser: 1.36 mg/dL — ABNORMAL HIGH (ref 0.61–1.24)
GFR calc Af Amer: >60 mL/min (ref >60)
GFR calc Af Amer: >60 mL/min (ref >60)
GFR calc non Af Amer: >60 mL/min (ref >60)
GFR calc non Af Amer: >60 mL/min (ref >60)
Glucose, Bld: 132 mg/dL — ABNORMAL HIGH (ref 70–99)
Glucose, Bld: 148 mg/dL — ABNORMAL HIGH (ref 70–99)
Potassium: 4 mmol/L (ref 3.5–5.1)
Potassium: 3.5 mmol/L (ref 3.5–5.1)
Sodium: 130 mmol/L — ABNORMAL LOW (ref 135–145)
Sodium: 130 mmol/L — ABNORMAL LOW (ref 135–145)

## 2018-11-12 LAB — CBG MONITORING, ED
Glucose-Capillary: 101 mg/dL — ABNORMAL HIGH (ref 70–99)
Glucose-Capillary: 131 mg/dL — ABNORMAL HIGH (ref 70–99)
Glucose-Capillary: 136 mg/dL — ABNORMAL HIGH (ref 70–99)
Glucose-Capillary: 138 mg/dL — ABNORMAL HIGH (ref 70–99)
Glucose-Capillary: 141 mg/dL — ABNORMAL HIGH (ref 70–99)
Glucose-Capillary: 149 mg/dL — ABNORMAL HIGH (ref 70–99)
Glucose-Capillary: 157 mg/dL — ABNORMAL HIGH (ref 70–99)
Glucose-Capillary: 159 mg/dL — ABNORMAL HIGH (ref 70–99)
Glucose-Capillary: 171 mg/dL — ABNORMAL HIGH (ref 70–99)
Glucose-Capillary: 181 mg/dL — ABNORMAL HIGH (ref 70–99)
Glucose-Capillary: 268 mg/dL — ABNORMAL HIGH (ref 70–99)

## 2018-11-12 LAB — HEMOGLOBIN A1C
Hgb A1c MFr Bld: 12.2 % — ABNORMAL HIGH (ref 4.8–5.6)
Mean Plasma Glucose: 303.44 mg/dL

## 2018-11-12 LAB — HIV ANTIBODY (ROUTINE TESTING W REFLEX): HIV Screen 4th Generation wRfx: NONREACTIVE

## 2018-11-12 LAB — GLUCOSE, CAPILLARY
Glucose-Capillary: 193 mg/dL — ABNORMAL HIGH (ref 70–99)
Glucose-Capillary: 211 mg/dL — ABNORMAL HIGH (ref 70–99)
Glucose-Capillary: 235 mg/dL — ABNORMAL HIGH (ref 70–99)

## 2018-11-12 MED ORDER — INSULIN STARTER KIT- PEN NEEDLES (ENGLISH)
1.0000 | Freq: Once | Status: DC
  Filled 2018-11-12: qty 1

## 2018-11-12 MED ORDER — PANTOPRAZOLE SODIUM 40 MG PO TBEC
40.0000 mg | DELAYED_RELEASE_TABLET | Freq: Every day | ORAL | Status: DC
  Administered 2018-11-13: 40 mg via ORAL
  Filled 2018-11-12: qty 1

## 2018-11-12 MED ORDER — INSULIN ASPART 100 UNIT/ML ~~LOC~~ SOLN
15.0000 [IU] | Freq: Three times a day (TID) | SUBCUTANEOUS | Status: DC
  Administered 2018-11-12 – 2018-11-13 (×3): 15 [IU] via SUBCUTANEOUS
  Filled 2018-11-12: qty 1

## 2018-11-12 MED ORDER — FAMOTIDINE IN NACL 20-0.9 MG/50ML-% IV SOLN
20.0000 mg | Freq: Once | INTRAVENOUS | Status: AC
  Administered 2018-11-12: 20 mg via INTRAVENOUS
  Filled 2018-11-12: qty 50

## 2018-11-12 MED ORDER — PANTOPRAZOLE SODIUM 40 MG IV SOLR
40.0000 mg | Freq: Once | INTRAVENOUS | Status: AC
  Administered 2018-11-12: 10:00:00 40 mg via INTRAVENOUS
  Filled 2018-11-12: qty 40

## 2018-11-12 MED ORDER — LIVING WELL WITH DIABETES BOOK
Freq: Once | Status: DC
  Filled 2018-11-12: qty 1

## 2018-11-12 MED ORDER — INSULIN STARTER KIT- PEN NEEDLES (ENGLISH)
1.0000 | Freq: Once | Status: AC
  Administered 2018-11-13: 13:00:00 1
  Filled 2018-11-12: qty 1

## 2018-11-12 MED ORDER — INSULIN ASPART 100 UNIT/ML ~~LOC~~ SOLN
0.0000 [IU] | Freq: Three times a day (TID) | SUBCUTANEOUS | Status: DC
  Administered 2018-11-12 – 2018-11-13 (×3): 5 [IU] via SUBCUTANEOUS

## 2018-11-12 MED ORDER — INSULIN GLARGINE 100 UNIT/ML ~~LOC~~ SOLN
60.0000 [IU] | SUBCUTANEOUS | Status: DC
  Administered 2018-11-12 – 2018-11-13 (×2): 60 [IU] via SUBCUTANEOUS
  Filled 2018-11-12 (×6): qty 0.6

## 2018-11-12 NOTE — Progress Notes (Signed)
Inpatient Diabetes Program Recommendations  AACE/ADA: New Consensus Statement on Inpatient Glycemic Control (2015)  Target Ranges:  Prepandial:   less than 140 mg/dL      Peak postprandial:   less than 180 mg/dL (1-2 hours)      Critically ill patients:  140 - 180 mg/dL   Lab Results  Component Value Date   GLUCAP 193 (H) 11/12/2018   HGBA1C 12.2 (H) 11/11/2018    Review of Glycemic Control  Diabetes history: Pre-diabetes Outpatient Diabetes medications: metformin 500 mg bid  Current orders for Inpatient glycemic control: IV insulin transitioning to Lantus 60 units QAM, Novolog 15 units tidwc + 0-15 units tidwc.  HgbA1C - 12.2%.  Spoke with pt about new diagnosis and going home on insulin. Discussed HgbA1C of 12.2%. Pt was reading Living Well book with wife and had good questions. Discussed diet, exercise, monitoring, and f/u with PCP. Pt states he has appt on Monday August 10th with MD. Prefers insulin pen. Will order insulin pen starter kit and RN to begin teaching insulin pen administration. Will also order OP Diabetes Education consult.  Pt seems motivated to get blood sugars under control.   Diabetes Coordinator to f/u in am.  Thank you. Rhonda Vaughan, RD, LDN, CDE Inpatient Diabetes Coordinator [phone]      

## 2018-11-12 NOTE — ED Notes (Signed)
Spoke with Dr. Olevia Bowens, labs reported, order to decrease insulin drip to 3.0

## 2018-11-12 NOTE — Progress Notes (Signed)
PROGRESS NOTE    Jose Bailey  HQI:[id number]  DOB: 01/04/1986  DOA: 11/11/2018 [facility]CP[facility]   Brief Admission Hx: 33 year old male admitted with DKA a for newly diagnosed uncontrolled diabetes mellitus with a hemoglobin A1c greater than 13% and CKD stage I/II and hypogonadism being [facility].  MDM/Assessment & Plan:   1. Diabetic ketoacidosis- patient's anion gap is closed.  He is feeling better and he is able to eat and drink at this time.  He remains on the IV insulin infusion at a rate of approximately 3 units/h.  He is being transitioned to Lantus 60 units with NovoLog 15 units 3 times daily with meals plus supplemental sliding scale coverage.  Follow C-peptide and GAD 65 antibodies.  Insulin drip to be discontinued 2 hours after Lantus is been given.  Continue to monitor CBG 5 times per day.  Start intensive diabetes education in the hospital.  I have consulted the diabetes coordinator to assist.  The patient's wife is also at the bedside and willing to learn how to manage the disease at home.  Carbohydrate modified diet ordered. 2. Hypogonadism-patient is followed at [facility] for this and was recently taken off of his medication for this. 3. GERD- patient having severe symptoms today given IV Protonix, Pepcid and Tums. 4. Metabolic acidosis- likely secondary to chronic kidney disease and DKA.  It is improving with treatment.  He may need to continue sodium bicarbonate tablets.  Continue IV bicarbonate infusion at this time.  Reduce dose to 50 cc per hour. 5. Hyponatremia-suspect this is secondary to dehydration as he has been severely dehydrated from this bout of DKA.  Recheck in a.m.  DVT prophylaxis: Lovenox Code Status: Full Family Communication: Patient's wife at bedside Disposition Plan: Inpatient   Consultants:  Diabetes coordinator  Procedures:  N/A  Antimicrobials:  N/A  Subjective: Patient  reports he is having acid reflux symptoms that have been pretty severe.  He otherwise is feeling much better than he did on admission.  Objective: Vitals:   11/12/18 0630 11/12/18 0700 11/12/18 0718 11/12/18 0850  BP: 134/70 121/73 121/73 117/81  Pulse: 100 (!) 104 91 86  Resp:  19 18 18   Temp:      TempSrc:      SpO2: 100% 98% 98% 98%  Weight:      Height:        Intake/Output Summary (Last 24 hours) at 11/12/2018 1244 Last data filed at 11/12/2018 [id number] Gross per 24 hour  Intake 2096.94 ml  Output 525 ml  Net 1571.94 ml   Filed Weights   11/11/18 1128  Weight: 106.6 kg     REVIEW OF SYSTEMS  As per history otherwise all reviewed and reported negative  Exam:  General exam: Awake, alert, no apparent distress, cooperative and pleasant. Respiratory system: Clear. No increased work of breathing. Cardiovascular system: S1 & S2 heard. No JVD, murmurs, gallops, clicks or pedal edema. Gastrointestinal system: Abdomen is nondistended, soft and nontender. Normal bowel sounds heard. Central nervous system: Alert and oriented. No focal neurological deficits. Extremities: no CCE.  Data Reviewed: Basic Metabolic Panel: Recent Labs  Lab 11/11/18 1213 11/11/18 1816 11/11/18 2141 11/12/18 0223 11/12/18 0628  NA 128* 129* 129* 130* 130*  K 5.3* 5.3* 4.2 4.0 3.5  CL 97* 104 106 106 104  CO2 8* 7* 10* 14* 16*  GLUCOSE 397* 307* 174* 132* 148*  BUN 18 18 15 13 13   CREATININE 1.84*  1.70* 1.45* 1.43* 1.36*  CALCIUM 9.6 8.5* 8.4* 8.8* 8.8*  MG  --  2.2  --   --   --    Liver Function Tests: Recent Labs  Lab 11/07/18 1526 11/11/18 1213  AST 30 20  ALT 39 32  ALKPHOS 62 71  BILITOT 1.4* 1.4*  PROT 8.2* 9.1*  ALBUMIN 4.7 5.2*   No results for input(s): LIPASE, AMYLASE in the last 168 hours. No results for input(s): AMMONIA in the last 168 hours. CBC: Recent Labs  Lab 11/07/18 1526 11/11/18 1213  WBC 7.9 9.9  NEUTROABS 5.3 8.0*  HGB 16.3 17.6*  HCT 47.5 54.5*  MCV  81.2 84.1  PLT 259 292   Cardiac Enzymes: No results for input(s): CKTOTAL, CKMB, CKMBINDEX, TROPONINI in the last 168 hours. CBG (last 3)  Recent Labs    11/12/18 0916 11/12/18 1038 11/12/18 1219  GLUCAP 131* 268* 101*   Recent Results (from the past 240 hour(s))  SARS Coronavirus 2 [facility](Hospital order, Performed in [facility] hospital lab) Nasopharyngeal Nasopharyngeal Swab     Status: None   Collection Time: 11/11/18  3:47 PM   Specimen: Nasopharyngeal Swab  Result Value Ref Range Status   SARS Coronavirus 2 NEGATIVE NEGATIVE Final    Comment: (NOTE) If result is NEGATIVE SARS-CoV-2 target nucleic acids are NOT DETECTED. The SARS-CoV-2 RNA is generally detectable in upper and lower  respiratory specimens during the acute phase of infection. The lowest  concentration of SARS-CoV-2 viral copies this assay can detect is 250  copies / mL. A negative result does not preclude SARS-CoV-2 infection  and should not be used as the sole basis for treatment or other  patient management decisions.  A negative result may occur with  improper specimen collection / handling, submission of specimen other  than nasopharyngeal swab, presence of viral mutation(s) within the  areas targeted by this assay, and inadequate number of viral copies  (<250 copies / mL). A negative result must be combined with clinical  observations, patient history, and epidemiological information. If result is POSITIVE SARS-CoV-2 target nucleic acids are DETECTED. The SARS-CoV-2 RNA is generally detectable in upper and lower  respiratory specimens dur ing the acute phase of infection.  Positive  results are indicative of active infection with SARS-CoV-2.  Clinical  correlation with patient history and other diagnostic information is  necessary to determine patient infection status.  Positive results do  not rule out bacterial infection or co-infection with other viruses. If result is PRESUMPTIVE POSTIVE SARS-CoV-2  nucleic acids MAY BE PRESENT.   A presumptive positive result was obtained on the submitted specimen  and confirmed on repeat testing.  While 2019 novel coronavirus  (SARS-CoV-2) nucleic acids may be present in the submitted sample  additional confirmatory testing may be necessary for epidemiological  and / or clinical management purposes  to differentiate between  SARS-CoV-2 and other Sarbecovirus currently known to infect humans.  If clinically indicated additional testing with an alternate test  methodology [phone]([id number]) is advised. The SARS-CoV-2 RNA is generally  detectable in upper and lower respiratory sp ecimens during the acute  phase of infection. The expected result is Negative. Fact Sheet for Patients:  BoilerBrush.com.cyhttps://www.fda.gov/media/136312/download Fact Sheet for Healthcare Providers: https://pope.com/https://www.fda.gov/media/136313/download This test is not yet approved or cleared by the [place] FDA and has been authorized for detection and/or diagnosis of SARS-CoV-2 by FDA under an Emergency Use Authorization (EUA).  This EUA will remain in effect (meaning this test can be used)  for the duration of the COVID-19 declaration under Section 564(b)(1) of the Act, 21 U.S.C. section 360bbb-3(b)(1), unless the authorization is terminated or revoked sooner. Performed at [facility], [address] [place], [place] [address]      Studies: No results found.   Scheduled Meds: . enoxaparin (LOVENOX) injection  40 mg Subcutaneous Q24H  . insulin aspart  0-15 Units Subcutaneous TID WC  . insulin aspart  15 Units Subcutaneous TID WC  . insulin glargine  60 Units Subcutaneous Q24H  . living well with diabetes book   Does not apply Once  . [START ON 11/13/2018] pantoprazole  40 mg Oral Q0600   Continuous Infusions: . famotidine (PEPCID) IV    . insulin 3.9 Units/hr (11/12/18 0739)  .  sodium bicarbonate  infusion 1000 mL 50 mL/hr at 11/12/18 2993    Active Problems:   DKA (diabetic  ketoacidoses) (HCC)   CKD (chronic kidney disease) stage 1, GFR 90 ml/min or greater   Hyponatremia   Metabolic acidosis   Hypogonadism in male   Time spent:   Irwin Brakeman, MD [facility] 11/12/2018, 12:44 PM    LOS: 1 day  How to contact the [facility] Attending or Consulting provider [place] or covering provider during after hours [place], for this patient?  1. Check the care team in [facility] and look for a) attending/consulting [facility] provider listed and b) the [facility] team listed 2. Log into [email or web address] and use [place]'s universal password to access. If you do not have the password, please contact the hospital operator. 3. Locate the [facility] provider you are looking for under [facility] and page to a number that you can be directly reached. 4. If you still have difficulty reaching the provider, please page the [facility] (Director on Call) for the Hospitalists listed on amion for assistance.

## 2018-11-13 DIAGNOSIS — E1110 Type 2 diabetes mellitus with ketoacidosis without coma: Principal | ICD-10-CM

## 2018-11-13 DIAGNOSIS — N179 Acute kidney failure, unspecified: Secondary | ICD-10-CM | POA: Diagnosis present

## 2018-11-13 LAB — RENAL FUNCTION PANEL
Albumin: 3.9 g/dL (ref 3.5–5.0)
Anion gap: 9 (ref 5–15)
BUN: 11 mg/dL (ref 6–20)
CO2: 24 mmol/L (ref 22–32)
Calcium: 9.1 mg/dL (ref 8.9–10.3)
Chloride: 100 mmol/L (ref 98–111)
Creatinine, Ser: 1.2 mg/dL (ref 0.61–1.24)
GFR calc Af Amer: >60 mL/min (ref >60)
GFR calc non Af Amer: >60 mL/min (ref >60)
Glucose, Bld: 240 mg/dL — ABNORMAL HIGH (ref 70–99)
Phosphorus: 2.6 mg/dL (ref 2.5–4.6)
Potassium: 2.9 mmol/L — ABNORMAL LOW (ref 3.5–5.1)
Sodium: 133 mmol/L — ABNORMAL LOW (ref 135–145)

## 2018-11-13 LAB — CBC
HCT: 41.7 % (ref 39.0–52.0)
Hemoglobin: 14.3 g/dL (ref 13.0–17.0)
MCH: 27.6 pg (ref 26.0–34.0)
MCHC: 34.3 g/dL (ref 30.0–36.0)
MCV: 80.3 fL (ref 80.0–100.0)
Platelets: 201 10*3/uL (ref 150–400)
RBC: 5.19 MIL/uL (ref 4.22–5.81)
RDW: 12.1 % (ref 11.5–15.5)
WBC: 4.8 10*3/uL (ref 4.0–10.5)
nRBC: 0 % (ref 0.0–0.2)

## 2018-11-13 LAB — GLUCOSE, CAPILLARY
Glucose-Capillary: 220 mg/dL — ABNORMAL HIGH (ref 70–99)
Glucose-Capillary: 232 mg/dL — ABNORMAL HIGH (ref 70–99)
Glucose-Capillary: 322 mg/dL — ABNORMAL HIGH (ref 70–99)
Glucose-Capillary: 250 mg/dL — ABNORMAL HIGH (ref 70–99)
Glucose-Capillary: 248 mg/dL — ABNORMAL HIGH (ref 70–99)

## 2018-11-13 LAB — MAGNESIUM: Magnesium: 2.3 mg/dL (ref 1.7–2.4)

## 2018-11-13 LAB — C-PEPTIDE: C-Peptide: 0.3 ng/mL — ABNORMAL LOW (ref 1.1–4.4)

## 2018-11-13 MED ORDER — METFORMIN HCL ER 500 MG PO TB24: ORAL_TABLET | ORAL | 1 refills | Status: DC

## 2018-11-13 MED ORDER — NOVOLOG FLEXPEN 100 UNIT/ML ~~LOC~~ SOPN: 18.0000 [IU] | PEN_INJECTOR | Freq: Three times a day (TID) | SUBCUTANEOUS | 0 refills | Status: DC

## 2018-11-13 MED ORDER — INSULIN ASPART 100 UNIT/ML ~~LOC~~ SOLN
18.0000 [IU] | Freq: Three times a day (TID) | SUBCUTANEOUS | Status: DC
  Administered 2018-11-13: 18 [IU] via SUBCUTANEOUS

## 2018-11-13 MED ORDER — INSULIN PEN NEEDLE 31G X 5 MM MISC: 1.0000 | 2 refills | Status: DC

## 2018-11-13 MED ORDER — POTASSIUM CHLORIDE CRYS ER 20 MEQ PO TBCR
60.0000 meq | EXTENDED_RELEASE_TABLET | Freq: Once | ORAL | Status: AC
  Administered 2018-11-13: 60 meq via ORAL
  Filled 2018-11-13: qty 3

## 2018-11-13 MED ORDER — BLOOD GLUCOSE METER KIT: PACK | 1 refills | Status: DC

## 2018-11-13 MED ORDER — PANTOPRAZOLE SODIUM 40 MG PO TBEC: 40.0000 mg | DELAYED_RELEASE_TABLET | Freq: Every day | ORAL | 1 refills | Status: DC

## 2018-11-13 MED ORDER — INSULIN STARTER KIT- PEN NEEDLES (ENGLISH)
1.0000 | Freq: Once | Status: DC
  Filled 2018-11-13: qty 1

## 2018-11-13 MED ORDER — LANTUS SOLOSTAR 100 UNIT/ML ~~LOC~~ SOPN: 60.0000 [IU] | PEN_INJECTOR | Freq: Every day | SUBCUTANEOUS | 0 refills | Status: DC

## 2018-11-13 NOTE — Discharge Summary (Signed)
Physician Discharge Summary  Jose Bailey GHW:[id number] DOB: 1985/04/26 DOA: 11/11/2018  PCP: [facility] date: 11/11/2018 Discharge date: 11/13/2018  Admitted From: home  Disposition: Home   Recommendations for Outpatient Follow-up:  1. Follow up with PCP on Monday 11/16/18 as scheduled 2. Establish care with a qualified diabetologist asap 3. Please obtain BMP/CBC in one week 4. Please arrange outpatient diabetes education  Discharge Condition: STABLE   CODE STATUS: FULL    Brief Hospitalization Summary: Please see all hospital notes, images, labs for full details of the hospitalization. Dr. Talmadge Coventry HPI: Jose Bailey is a 33 y.o. male with medical history significant for newly diagnosed diabetes mellitus, presented to the ED with complaints of vomiting, nausea and poor p.o. intake for 2 days.  Patient was seen in the ED-11/07/2018 for complaints of increased thirst urination and not feeling well, blood glucose was 443, insulin given and fluids patient was discharged home with a prescription for metformin.  Patient took 4 doses of metformin but after the second dose developed symptoms of vomiting and nausea.  Also reports constipation, last bowel movement was 2 days ago.  He has had about 3 episodes of vomiting daily.  No abdominal pain.  He denies difficulty breathing or cough, tells me when he came in he was not actually short of breath he just felt very dehydrated and weak. He also reports pain with urination that started yesterday.  ED Course: Tachycardia 117.  Blood pressure 113/58.  Anion gap of greater than 20 with serum bicarb of 8, sodium 138, potassium 5.3.  Creatinine 1.8, random glucose 397.  WBC 9.9 with hemoglobin of 17.6.  UA pending.  Patient started on insulin GTT.  Hospitalist to admit for DKA.  Brief Admission Hx: 33 year old male admitted with DKA a for newly diagnosed uncontrolled diabetes mellitus with a hemoglobin A1c  greater than 13% and CKD stage I/II and hypogonadism being treated at [facility]:   1. Diabetic ketoacidosis- RESOLVED.  Patient's anion gap is closed.  He is feeling better and he is able to eat and drink at this time.  He remains on the IV insulin infusion at a rate of approximately 3 units/h.  He was transitioned to Lantus 60 units with NovoLog 15 units 3 times daily with meals plus supplemental sliding scale coverage.  Follow C-peptide and GAD 65 antibodies.  Insulin drip was discontinued 2 hours after Lantus was given.  Continue to monitor CBG closely at home.  He is eating and drinking well.  He wants to go home.  He is motivated to care for his diabeted.  We started intensive diabetes education in the hospital.  I have consulted the diabetes coordinator to assist.  The patient's wife is also at the bedside and willing to learn how to manage the disease at home.  Carbohydrate modified diet ordered.  Started Glucophage XR 500 mg BID with meals. 2. AKI - prerenal from severe dehydration, creatinine normalized now.   3. Hypogonadism-patient is followed at [facility] for this and was recently taken off of his medication for this. 4. GERD- patient having severe symptoms today given IV Protonix, Pepcid and Tums. 5. Metabolic acidosis- RESOLVED.  Likely secondary to AKI and DKA.  It is resolved now with treatment.  DVT prophylaxis: Lovenox Code Status: Full Family Communication: Patient's wife at bedside Disposition Plan: Inpatient   Consultants:  Diabetes coordinator  Procedures:  N/A  Antimicrobials:  N/A  Discharge  Diagnoses:  Active Problems:   DKA (diabetic ketoacidoses) (HCC)   Hyponatremia   Metabolic acidosis   Hypogonadism in male   AKI (acute kidney injury) [facility])   Discharge Instructions: Discharge Instructions    Call MD for:  extreme fatigue   Complete by: As directed    Call MD for:  persistant dizziness or light-headedness    Complete by: As directed    Call MD for:  persistant nausea and vomiting   Complete by: As directed    Increase activity slowly   Complete by: As directed    Referral to Nutrition and Diabetes Services   Complete by: As directed    Choose type of Diabetes Self-Management Training (DSMT) training services and number of hours requested: Initial DSMT: 10 hours   Check all special needs that apply to patient requiring 1 on 1 DSMT: Telehealth   DSMT Content: Comprehensive self-management skills- All of the content areas   Choose the type of Medical Nutrition Therapy (MNT) and number of hours: Initial MNT: 3 hours   FOR MEDICARE PATIENTS: I hereby certify that I am managing this beneficiary's diabetes condition and that the above prescribed training is a necessary part of management.: Does not apply     Allergies as of 11/13/2018      Reactions   Penicillins Hives   .Did it involve swelling of the face/tongue/throat, SOB, or low BP? No Did it involve sudden or severe rash/hives, skin peeling, or any reaction on the inside of your mouth or nose? Yes Did you need to seek medical attention at a hospital or doctor's office? Yes When did it last happen?Childhood If all above answers are "NO", may proceed with cephalosporin use.      Medication List    STOP taking these medications   metFORMIN 500 MG tablet Commonly known as: GLUCOPHAGE Replaced by: metFORMIN 500 MG 24 hr tablet     TAKE these medications   blood glucose meter kit and supplies Dispense based on patient and insurance preference. Use up to four times daily as directed. (FOR ICD-10 E10.9, E11.9).   cyanocobalamin 1000 MCG/ML injection Commonly known as: (VITAMIN B-12) Inject 1 mL into the skin every 30 (thirty) days.   Insulin Pen Needle 31G X 5 MM Misc 1 Device by Does not apply route as directed.   Lantus SoloStar 100 UNIT/ML Solostar Pen Generic drug: Insulin Glargine Inject 60 Units into the skin daily.    metFORMIN 500 MG 24 hr tablet Commonly known as: Glucophage XR Take 1 po BID with meals Replaces: metFORMIN 500 MG tablet   NovoLOG FlexPen 100 UNIT/ML FlexPen Generic drug: insulin aspart Inject 18 Units into the skin 3 (three) times daily with meals.   pantoprazole 40 MG tablet Commonly known as: PROTONIX Take 1 tablet (40 mg total) by mouth daily at 6 (six) AM. Start taking on: November 14, 2018      Follow-up Information    [facility], [facility] up on 11/16/2018.   Specialty: Family Medicine Why: [phone]   FAX: [phone] Monday at 8:15 with Dr Georgia Dom, MD. Schedule an appointment as soon as possible for a visit in 2 week(s).   Specialty: Endocrinology Why: Establish care with diabetes specialist Contact information: [place] [address] [phone]          Allergies  Allergen Reactions  . Penicillins Hives    .Did it involve swelling  of the face/tongue/throat, SOB, or low BP? No Did it involve sudden or severe rash/hives, skin peeling, or any reaction on the inside of your mouth or nose? Yes Did you need to seek medical attention at a hospital or doctor's office? Yes When did it last happen?Childhood If all above answers are "NO", may proceed with cephalosporin use.    Allergies as of 11/13/2018      Reactions   Penicillins Hives   .Did it involve swelling of the face/tongue/throat, SOB, or low BP? No Did it involve sudden or severe rash/hives, skin peeling, or any reaction on the inside of your mouth or nose? Yes Did you need to seek medical attention at a hospital or doctor's office? Yes When did it last happen?Childhood If all above answers are "NO", may proceed with cephalosporin use.      Medication List    STOP taking these medications   metFORMIN 500 MG tablet Commonly known as: GLUCOPHAGE Replaced by: metFORMIN 500 MG 24 hr tablet     TAKE these  medications   blood glucose meter kit and supplies Dispense based on patient and insurance preference. Use up to four times daily as directed. (FOR ICD-10 E10.9, E11.9).   cyanocobalamin 1000 MCG/ML injection Commonly known as: (VITAMIN B-12) Inject 1 mL into the skin every 30 (thirty) days.   Insulin Pen Needle 31G X 5 MM Misc 1 Device by Does not apply route as directed.   Lantus SoloStar 100 UNIT/ML Solostar Pen Generic drug: Insulin Glargine Inject 60 Units into the skin daily.   metFORMIN 500 MG 24 hr tablet Commonly known as: Glucophage XR Take 1 po BID with meals Replaces: metFORMIN 500 MG tablet   NovoLOG FlexPen 100 UNIT/ML FlexPen Generic drug: insulin aspart Inject 18 Units into the skin 3 (three) times daily with meals.   pantoprazole 40 MG tablet Commonly known as: PROTONIX Take 1 tablet (40 mg total) by mouth daily at 6 (six) AM. Start taking on: November 14, 2018       Procedures/Studies:  No results found.   Subjective: Pt without complaints.  Pt motivated to care for himself and worked with diabetes educators.   Discharge Exam: Vitals:   11/13/18 0451 11/13/18 0851  BP: 114/68 (!) 143/89  Pulse: 71 76  Resp: 16 16  Temp: 98.1 F (36.7 C) 98.1 F (36.7 C)  SpO2: 100% 100%   Vitals:   11/12/18 2148 11/13/18 0150 11/13/18 0451 11/13/18 0851  BP: (!) 143/85 133/80 114/68 (!) 143/89  Pulse: 80 87 71 76  Resp: '16 18 16 16  '$ Temp: 98.9 F (37.2 C) 98.5 F (36.9 C) 98.1 F (36.7 C) 98.1 F (36.7 C)  TempSrc: Oral Oral Oral Oral  SpO2: 100% 100% 100% 100%  Weight:      Height:       General: Pt is alert, awake, not in acute distress Cardiovascular: RRR, S1/S2 +, no rubs, no gallops Respiratory: CTA bilaterally, no wheezing, no rhonchi Abdominal: Soft, NT, ND, bowel sounds + Extremities: no edema, no cyanosis   The results of significant diagnostics from this hospitalization (including imaging, microbiology, ancillary and laboratory) are  listed below for reference.     Microbiology: Recent Results (from the past 240 hour(s))  SARS Coronavirus 2 [facility] order, Performed in [facility] hospital lab) Nasopharyngeal Nasopharyngeal Swab     Status: None   Collection Time: 11/11/18  3:47 PM   Specimen: Nasopharyngeal Swab  Result Value Ref Range Status   SARS  Coronavirus 2 NEGATIVE NEGATIVE Final    Comment: (NOTE) If result is NEGATIVE SARS-CoV-2 target nucleic acids are NOT DETECTED. The SARS-CoV-2 RNA is generally detectable in upper and lower  respiratory specimens during the acute phase of infection. The lowest  concentration of SARS-CoV-2 viral copies this assay can detect is 250  copies / mL. A negative result does not preclude SARS-CoV-2 infection  and should not be used as the sole basis for treatment or other  patient management decisions.  A negative result may occur with  improper specimen collection / handling, submission of specimen other  than nasopharyngeal swab, presence of viral mutation(s) within the  areas targeted by this assay, and inadequate number of viral copies  (<250 copies / mL). A negative result must be combined with clinical  observations, patient history, and epidemiological information. If result is POSITIVE SARS-CoV-2 target nucleic acids are DETECTED. The SARS-CoV-2 RNA is generally detectable in upper and lower  respiratory specimens dur ing the acute phase of infection.  Positive  results are indicative of active infection with SARS-CoV-2.  Clinical  correlation with patient history and other diagnostic information is  necessary to determine patient infection status.  Positive results do  not rule out bacterial infection or co-infection with other viruses. If result is PRESUMPTIVE POSTIVE SARS-CoV-2 nucleic acids MAY BE PRESENT.   A presumptive positive result was obtained on the submitted specimen  and confirmed on repeat testing.  While 2019 novel coronavirus  (SARS-CoV-2)  nucleic acids may be present in the submitted sample  additional confirmatory testing may be necessary for epidemiological  and / or clinical management purposes  to differentiate between  SARS-CoV-2 and other Sarbecovirus currently known to infect humans.  If clinically indicated additional testing with an alternate test  methodology [phone]) is advised. The SARS-CoV-2 RNA is generally  detectable in upper and lower respiratory sp ecimens during the acute  phase of infection. The expected result is Negative. Fact Sheet for Patients:  StrictlyIdeas.no Fact Sheet for Healthcare Providers: BankingDealers.co.za This test is not yet approved or cleared by the [place] FDA and has been authorized for detection and/or diagnosis of SARS-CoV-2 by FDA under an Emergency Use Authorization (EUA).  This EUA will remain in effect (meaning this test can be used) for the duration of the COVID-19 declaration under Section 564(b)(1) of the Act, 21 U.S.C. section 360bbb-3(b)(1), unless the authorization is terminated or revoked sooner. Performed at [facility], [address], [place], [place] [address]      Labs: BNP (last 3 results) No results for input(s): BNP in the last 8760 hours. Basic Metabolic Panel: Recent Labs  Lab 11/11/18 1816 11/11/18 2141 11/12/18 0223 11/12/18 0628 11/13/18 0630  NA 129* 129* 130* 130* 133*  K 5.3* 4.2 4.0 3.5 2.9*  CL 104 106 106 104 100  CO2 7* 10* 14* 16* 24  GLUCOSE 307* 174* 132* 148* 240*  BUN '18 15 13 13 11  '$ CREATININE 1.70* 1.45* 1.43* 1.36* 1.20  CALCIUM 8.5* 8.4* 8.8* 8.8* 9.1  MG 2.2  --   --   --  2.3  PHOS  --   --   --   --  2.6   Liver Function Tests: Recent Labs  Lab 11/07/18 1526 11/11/18 1213 11/13/18 0630  AST 30 20  --   ALT 39 32  --   ALKPHOS 62 71  --   BILITOT 1.4* 1.4*  --   PROT 8.2* 9.1*  --   ALBUMIN  4.7 5.2* 3.9   No results for input(s): LIPASE, AMYLASE in the  last 168 hours. No results for input(s): AMMONIA in the last 168 hours. CBC: Recent Labs  Lab 11/07/18 1526 11/11/18 1213 11/13/18 0630  WBC 7.9 9.9 4.8  NEUTROABS 5.3 8.0*  --   HGB 16.3 17.6* 14.3  HCT 47.5 54.5* 41.7  MCV 81.2 84.1 80.3  PLT 259 292 201   Cardiac Enzymes: No results for input(s): CKTOTAL, CKMB, CKMBINDEX, TROPONINI in the last 168 hours. BNP: Invalid input(s): POCBNP CBG: Recent Labs  Lab 11/13/18 0648 11/13/18 0744 11/13/18 0958 11/13/18 1115 11/13/18 1136  GLUCAP 220* 232* 322* 250* 248*   D-Dimer No results for input(s): DDIMER in the last 72 hours. Hgb A1c Recent Labs    11/11/18 1816  HGBA1C 12.2*   Lipid Profile No results for input(s): CHOL, HDL, LDLCALC, TRIG, CHOLHDL, LDLDIRECT in the last 72 hours. Thyroid function studies No results for input(s): TSH, T4TOTAL, T3FREE, THYROIDAB in the last 72 hours.  Invalid input(s): FREET3 Anemia work up No results for input(s): VITAMINB12, FOLATE, FERRITIN, TIBC, IRON, RETICCTPCT in the last 72 hours. Urinalysis    Component Value Date/Time   COLORURINE STRAW (A) 11/11/2018 1134   APPEARANCEUR CLEAR 11/11/2018 1134   LABSPEC 1.022 11/11/2018 1134   PHURINE 5.0 11/11/2018 1134   GLUCOSEU >=500 (A) 11/11/2018 1134   HGBUR MODERATE (A) 11/11/2018 1134   BILIRUBINUR NEGATIVE 11/11/2018 1134   KETONESUR 80 (A) 11/11/2018 1134   PROTEINUR 100 (A) 11/11/2018 1134   NITRITE NEGATIVE 11/11/2018 1134   LEUKOCYTESUR NEGATIVE 11/11/2018 1134   Sepsis Labs Invalid input(s): PROCALCITONIN,  WBC,  LACTICIDVEN Microbiology Recent Results (from the past 240 hour(s))  SARS Coronavirus 2 [facility] order, Performed in [facility] hospital lab) Nasopharyngeal Nasopharyngeal Swab     Status: None   Collection Time: 11/11/18  3:47 PM   Specimen: Nasopharyngeal Swab  Result Value Ref Range Status   SARS Coronavirus 2 NEGATIVE NEGATIVE Final    Comment: (NOTE) If result is NEGATIVE SARS-CoV-2 target  nucleic acids are NOT DETECTED. The SARS-CoV-2 RNA is generally detectable in upper and lower  respiratory specimens during the acute phase of infection. The lowest  concentration of SARS-CoV-2 viral copies this assay can detect is 250  copies / mL. A negative result does not preclude SARS-CoV-2 infection  and should not be used as the sole basis for treatment or other  patient management decisions.  A negative result may occur with  improper specimen collection / handling, submission of specimen other  than nasopharyngeal swab, presence of viral mutation(s) within the  areas targeted by this assay, and inadequate number of viral copies  (<250 copies / mL). A negative result must be combined with clinical  observations, patient history, and epidemiological information. If result is POSITIVE SARS-CoV-2 target nucleic acids are DETECTED. The SARS-CoV-2 RNA is generally detectable in upper and lower  respiratory specimens dur ing the acute phase of infection.  Positive  results are indicative of active infection with SARS-CoV-2.  Clinical  correlation with patient history and other diagnostic information is  necessary to determine patient infection status.  Positive results do  not rule out bacterial infection or co-infection with other viruses. If result is PRESUMPTIVE POSTIVE SARS-CoV-2 nucleic acids MAY BE PRESENT.   A presumptive positive result was obtained on the submitted specimen  and confirmed on repeat testing.  While 2019 novel coronavirus  (SARS-CoV-2) nucleic acids may be present in the submitted sample  additional confirmatory testing may be necessary for epidemiological  and / or clinical management purposes  to differentiate between  SARS-CoV-2 and other Sarbecovirus currently known to infect humans.  If clinically indicated additional testing with an alternate test  methodology [phone]) is advised. The SARS-CoV-2 RNA is generally  detectable in upper and lower  respiratory sp ecimens during the acute  phase of infection. The expected result is Negative. Fact Sheet for Patients:  StrictlyIdeas.no Fact Sheet for Healthcare Providers: BankingDealers.co.za This test is not yet approved or cleared by the [place] FDA and has been authorized for detection and/or diagnosis of SARS-CoV-2 by FDA under an Emergency Use Authorization (EUA).  This EUA will remain in effect (meaning this test can be used) for the duration of the COVID-19 declaration under Section 564(b)(1) of the Act, 21 U.S.C. section 360bbb-3(b)(1), unless the authorization is terminated or revoked sooner. Performed at [facility], [address] [place], [place] [address]    Time coordinating discharge: 33 minutes   SIGNED:  Irwin Brakeman, MD  [facility] 11/13/2018, 11:59 AM How to contact the [facility] Attending or Consulting provider [place] or covering provider during after hours [place], for this patient?  1. Check the care team in [facility] and look for a) attending/consulting [facility] provider listed and b) the [facility] team listed 2. Log into [email or web address] and use [place]'s universal password to access. If you do not have the password, please contact the hospital operator. 3. Locate the [facility] provider you are looking for under [facility] and page to a number that you can be directly reached. 4. If you still have difficulty reaching the provider, please page the [facility] (Director on Call) for the Hospitalists listed on amion for assistance.

## 2018-11-13 NOTE — Discharge Instructions (Signed)
IMPORTANT INFORMATION: PAY CLOSE ATTENTION   PHYSICIAN DISCHARGE INSTRUCTIONS  Follow with Primary care provider  Medicine, [place]  and other consultants as instructed by your Hospitalist Physician  [place] IF SYMPTOMS COME BACK, WORSEN OR NEW PROBLEM DEVELOPS   Please note: You were cared for by a hospitalist during your hospital stay. Every effort will be made to forward records to your primary care provider.  You can request that your primary care provider send for your hospital records if they have not received them.  Once you are discharged, your primary care physician will handle any further medical issues. Please note that NO REFILLS for any discharge medications will be authorized once you are discharged, as it is imperative that you return to your primary care physician (or establish a relationship with a primary care physician if you do not have one) for your post hospital discharge needs so that they can reassess your need for medications and monitor your lab values.  Please get a complete blood count and chemistry panel checked by your Primary MD at your next visit, and again as instructed by your Primary MD.  Get Medicines reviewed and adjusted: Please take all your medications with you for your next visit with your Primary MD  Laboratory/radiological data: Please request your Primary MD to go over all hospital tests and procedure/radiological results at the follow up, please ask your primary care provider to get all Hospital records sent to his/her office.  In some cases, they will be blood work, cultures and biopsy results pending at the time of your discharge. Please request that your primary care provider follow up on these results.  If you are diabetic, please bring your blood sugar readings with you to your follow up appointment with primary care.    Please call and make your follow up appointments as soon as  possible.    Also Note the following: If you experience worsening of your admission symptoms, develop shortness of breath, life threatening emergency, suicidal or homicidal thoughts you must seek medical attention immediately by calling 911 or calling your MD immediately  if symptoms less severe.  You must read complete instructions/literature along with all the possible adverse reactions/side effects for all the Medicines you take and that have been prescribed to you. Take any new Medicines after you have completely understood and accpet all the possible adverse reactions/side effects.   Do not drive when taking Pain medications or sleeping medications (Benzodiazepines)  Do not take more than prescribed Pain, Sleep and Anxiety Medications. It is not advisable to combine anxiety,sleep and pain medications without talking with your primary care practitioner  Special Instructions: If you have smoked or chewed Tobacco  in the last 2 yrs please stop smoking, stop any regular Alcohol  and or any Recreational drug use.  Wear Seat belts while driving.  Do not drive if taking any narcotic, mind altering or controlled substances or recreational drugs or alcohol.

## 2018-11-13 NOTE — Progress Notes (Signed)
Inpatient Diabetes Program Recommendations  AACE/ADA: New Consensus Statement on Inpatient Glycemic Control (2015)  Target Ranges:  Prepandial:   less than 140 mg/dL      Peak postprandial:   less than 180 mg/dL (1-2 hours)      Critically ill patients:  140 - 180 mg/dL   Lab Results  Component Value Date   GLUCAP 232 (H) 11/13/2018   HGBA1C 12.2 (H) 11/11/2018    Review of Glycemic Control  Inpatient Diabetes Program Recommendations:   Educated patient on insulin pen use at home. Reviewed contents of insulin flexpen starter kit. Reviewed all steps if insulin pen including attachment of needle, 2-unit air shot, dialing up dose, giving injection, removing needle, disposal of sharps, storage of unused insulin, disposal of insulin etc. Patient able to provide successful return demonstration. Also reviewed troubleshooting with insulin pen. MD to give patient Rxs for insulin pens and insulin pen needles. Reviewed nutrition questions and answered questions. Also reviewed hypoglycemia.  Thank you, Nani Gasser. Reilly Blades, RN, MSN, CDE  Diabetes Coordinator Inpatient Glycemic Control Team Team Pager [phone] (8am-5pm) 11/13/2018 9:54 AM

## 2018-11-13 NOTE — Progress Notes (Signed)
Initial Nutrition Assessment  DOCUMENTATION CODES:   Obesity unspecified  INTERVENTION:  -Education -Glucerna Shake po BID, each supplement provides 220 kcal and 10 grams of protein -MVI  NUTRITION DIAGNOSIS:   Food and nutrition related knowledge deficit related to limited prior education as evidenced by percent weight loss, per patient/family report, other (comment)(HgA1C 12.2).   GOAL:   Patient will meet greater than or equal to 90% of their needs   MONITOR:   Labs, PO intake, Weight trends  REASON FOR ASSESSMENT:   Malnutrition Screening Tool    ASSESSMENT:  33 year old male with medical history significant of newly diagnosed DM who presented to ED with complaints of constipation, pain with urination, n/v and poor po intake for 2 days. Recent 8/1 ED with polyemia, polydipsia, and GG 443; insulin given and patient discharged.  Very delightful patient with wife in room at time of RD visit. He had bites left of lunch and reports [place] with gravy, pots, peaches, and broccoli. Patient reports that he ate "too well" prior to admission. He stated that prior to the pandemic he would make himself an omelette for breakfast and [facility] for a workout prior to heading to his job. He packed his lunch (variety of sandwiches, or chicken legs, chips) and occasionally stop at a fast food restaurant before going home as a snack before dinner.   Patient reports changes in eating habits after COVID; reports daily [employer] (spicy chx biscuit, bo rounds, and lg sweet tea) and eating "junk food" out of boredom. Pt reports daily EtOH (beer, wine, liquor) Patient stated that he "is kind of glad that he got diagnosed with diabetes, because he was spinning out of control and he wants to live"   RD answered questions regarding how to read the food label, portion sizes, and sources of carbohydrates. Patient is eager to make lifestyle changes and being more physically active.   Patient endorses  25 lb wt loss over the past month, reporting UBW of 260 lbs. Patient stated that he is grateful for the wt loss feels better physically.   Nutrition Focused Physical Exam revealed some mild muscle wasting to the patellar region, and mild fat loss to the orbital region.  Current wt 107.8 kg (237 lbs) UBW 260lb; wt in July per pt report  Patient noted with 23 lb (8.8%) wt loss x 1 month; severe for time frame No weight history available for review  Medications reviewed and include: SSI with meals, novoLog 18 units with meals, Lantus 60 units daily, Protonix  Labs: K 2.9 Glucose 240 CBGS 193-235 Lab Results  Component Value Date   HGBA1C 12.2 (H) 11/11/2018     NUTRITION - FOCUSED PHYSICAL EXAM:    Most Recent Value  Orbital Region  Mild depletion  Upper Arm Region  No depletion  Thoracic and Lumbar Region  No depletion  Buccal Region  Mild depletion  Temple Region  No depletion  Clavicle Bone Region  No depletion  Clavicle and Acromion Bone Region  No depletion  Scapular Bone Region  No depletion  Dorsal Hand  No depletion  Patellar Region  Mild depletion  Anterior Thigh Region  No depletion  Posterior Calf Region  No depletion  Edema (RD Assessment)  None  Hair  Reviewed  Eyes  Reviewed  Mouth  Reviewed  Skin  Reviewed  Nails  Reviewed       Diet Order:   Diet Order  Diet Carb Modified Fluid consistency: Thin; Room service appropriate? Yes  Diet effective now              EDUCATION NEEDS:   Education needs have been addressed  Skin:  Skin Assessment: Reviewed RN Assessment  Last BM:  8/4  Height:   Ht Readings from Last 1 Encounters:  11/12/18 5\' 8"  (1.727 m)    Weight:   Wt Readings from Last 1 Encounters:  11/12/18 107.8 kg    Ideal Body Weight:  70 kg  BMI:  Body mass index is 36.14 kg/m.  Estimated Nutritional Needs:   Kcal:  2400-2600  Protein:  120-130  Fluid:  >2.4L    Lajuan Lines, RD, LDN  After  Hours/Weekend Pager: [phone]

## 2018-11-18 LAB — GLUTAMIC ACID DECARBOXYLASE AUTO ABS: Glutamic Acid Decarb Ab: <5 U/mL (ref 0.0–5.0)

## 2018-12-17 ENCOUNTER — Encounter: Payer: BC Managed Care – PPO | Attending: Family Medicine | Admitting: Nutrition

## 2018-12-17 ENCOUNTER — Encounter: Payer: Self-pay | Admitting: Nutrition

## 2018-12-17 ENCOUNTER — Telehealth: Payer: Self-pay | Admitting: Nutrition

## 2018-12-17 ENCOUNTER — Other Ambulatory Visit: Payer: Self-pay

## 2018-12-17 VITALS — Wt 200.0 lb

## 2018-12-17 DIAGNOSIS — E1111 Type 2 diabetes mellitus with ketoacidosis with coma: Secondary | ICD-10-CM

## 2018-12-17 NOTE — Progress Notes (Signed)
  Medical Nutrition Therapy:  Appt start time: 1400 end time:  1500.   Assessment:  Primary concerns today: Diabetes Type 2. Lives with his wife.  Just got out of the hospital for DKA. New Dx Dm Type 2. He was on Lantus and Novolog with meals but is now only on Lantus 20 units a day. He is Eating 3 meals per day. Has low blood sugars at times but after education session, he realizes he wasn't eating enough CHO and causing low blood sugars. Feels better now and getting back on a meal schedule. Walking some but still weak at times.    Lab Results  Component Value Date   HGBA1C 12.2 (H) 11/11/2018    Preferred Learning Style:    No preference indicated   Learning Readiness:    Ready  Change in progress   MEDICATIONS:   DIETARY INTAKE:   24-hr recall:   Eats 3 meals per day.  Usual physical activity: Usually walks  Estimated energy needs: 2000  calories 225 g carbohydrates 150 g protein 56 g fat  Progress Towards Goal(s):  In progress.   Nutritional Diagnosis:  NB-1.1 Food and nutrition-related knowledge deficit As related to Diabetees Type 2.  As evidenced by A1C  12.2%     Intervention: Nutrition and Diabetes education provided on My Plate, CHO counting, meal planning, portion sizes, timing of meals, avoiding snacks between meals unless having a low blood sugar, target ranges for A1C and blood sugars, signs/symptoms and treatment of hyper/hypoglycemia, monitoring blood sugars, taking medications as prescribed, benefits of exercising 30 minutes per day and prevention of complications of DM.  Goals Follow My Plate Eat three balanced meals per day Eat 60-75 grams of carbs per meal  Cut out snacks  Increase fresh fruits and vegetables. Drink only water  Walk 60 minutes 3-4 times per week. Take meds as prescribed   Teaching Method Utilized:  Visual Auditory Hands on  Handouts given during visit include:  The Plate Method   Meal Plan Card'  Diabetes  Instructions.  Barriers to learning/adherence to lifestyle change: none  Demonstrated degree of understanding via:  Teach Back   Monitoring/Evaluation:  Dietary intake, exercise, and body weight in 1 month(s).

## 2018-12-17 NOTE — Telephone Encounter (Signed)
Unable to reach patient on phone. Left VM on wife's phone to call to [place] visit.

## 2019-01-06 ENCOUNTER — Encounter: Payer: Self-pay | Admitting: Nutrition

## 2021-10-03 ENCOUNTER — Ambulatory Visit
Admission: EM | Admit: 2021-10-03 | Discharge: 2021-10-03 | Disposition: A | Discharge status: Home / Self Care | Payer: BC Managed Care – PPO | Attending: Nurse Practitioner | Admitting: Nurse Practitioner

## 2021-10-03 ENCOUNTER — Ambulatory Visit: Payer: Self-pay

## 2021-10-03 DIAGNOSIS — J029 Acute pharyngitis, unspecified: Secondary | ICD-10-CM

## 2021-10-03 LAB — POCT RAPID STREP A (OFFICE): Rapid Strep A Screen: NEGATIVE

## 2021-10-03 MED ORDER — LIDOCAINE VISCOUS HCL 2 % MT SOLN: 10.0000 mL | Freq: Four times a day (QID) | OROMUCOSAL | 0 refills | Status: DC | PRN

## 2021-10-03 NOTE — ED Provider Notes (Addendum)
[facility]    CSN: [id number] Arrival date & time: 10/03/21  1600      History   Chief Complaint Chief Complaint  Patient presents with   Eye Problem    HPI Jose Bailey is a 36 y.o. male.   The history is provided by the patient.   Patient presents for complaints of sore throat and eye symptoms.  Patient states he is currently on antibiotics for his eye symptoms that was started 1 to 2 days ago.  Ports his eye symptoms are improving.  He is currently seeing his eye doctor for his eye symptoms.  He complains of sore throat that started approximately 2 days ago.  Patient states that his throat pain worsens at night and in the morning and improves throughout the day.  He denies fever, chills, nasal congestion, runny nose, coughing, abdominal pain, or other GI symptoms.  He states he is taken multiple over-the-counter medications such as Zyrtec, Allegra, Mucinex, and use cough drops for his symptoms.  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  Past Medical History:  Diagnosis Date   Diabetes mellitus without complication [facility])     Patient Active Problem List   Diagnosis Date Noted   AKI (acute kidney injury) ([place]) 11/13/2018   Hyponatremia 74/16/3845   Metabolic acidosis 36/46/8032   Hypogonadism in male 11/12/2018   DKA (diabetic ketoacidoses) 11/11/2018    History reviewed. No pertinent surgical history.     Home Medications    Prior to Admission medications   Medication Sig Start Date End Date Taking? Authorizing Provider  lidocaine (XYLOCAINE) 2 % solution Use as directed 10 mLs in the mouth or throat every 6 (six) hours as needed for mouth pain. 10/03/21  Yes Jolaine Fryberger-Warren, Alda Lea, NP  blood glucose meter kit and supplies Dispense based on patient and insurance preference. Use up to four times daily as directed. (FOR ICD-10 E10.9, E11.9). 11/13/18   Johnson, Clanford L, MD  cyanocobalamin (,VITAMIN B-12,) 1000 MCG/ML  injection Inject 1 mL into the skin every 30 (thirty) days. 07/10/17   [provider]  insulin aspart (NOVOLOG FLEXPEN) 100 UNIT/ML FlexPen Inject 18 Units into the skin 3 (three) times daily with meals. 11/13/18   Johnson, Clanford L, MD  Insulin Glargine (LANTUS SOLOSTAR) 100 UNIT/ML Solostar Pen Inject 60 Units into the skin daily. 11/13/18   Johnson, Clanford L, MD  Insulin Pen Needle 31G X 5 MM MISC 1 Device by Does not apply route as directed. 11/13/18   Murlean Iba, MD  metFORMIN (GLUCOPHAGE XR) 500 MG 24 hr tablet Take 1 po BID with meals 11/13/18   Johnson, Clanford L, MD  pantoprazole (PROTONIX) 40 MG tablet Take 1 tablet (40 mg total) by mouth daily at 6 (six) AM. 11/14/18 12/14/18  Murlean Iba, MD    Family History Family History  Family history unknown: Yes    Social History Social History   Tobacco Use   Smoking status: Every Day    Types: Cigars   Smokeless tobacco: Never   Tobacco comments:    monthly  Vaping Use   Vaping Use: Never used  Substance Use Topics   Alcohol use: Yes    Comment: weekends   Drug use: Never     Allergies   Penicillins   Review of Systems Review of Systems Per HPI  Physical Exam Triage Vital Signs ED Triage Vitals [10/03/21 1649]  Enc Vitals Group  BP 127/84     Pulse Rate 90     Resp 19     Temp 98.8 F (37.1 C)     Temp src      SpO2 96 %     Weight      Height      Head Circumference      Peak Flow      Pain Score 0     Pain Loc      Pain Edu?      Excl. in [place]?    No data found.  Updated Vital Signs BP 127/84   Pulse 90   Temp 98.8 F (37.1 C)   Resp 19   SpO2 96%   Visual Acuity Right Eye Distance:   Left Eye Distance:   Bilateral Distance:    Right Eye Near:   Left Eye Near:    Bilateral Near:     Physical Exam Vitals and nursing note reviewed.  Constitutional:      General: He is not in acute distress.    Appearance: Normal appearance.  HENT:     Head: Normocephalic.      Right Ear: Tympanic membrane, ear canal and external ear normal.     Left Ear: Tympanic membrane, ear canal and external ear normal.     Nose: Nose normal.     Right Turbinates: Enlarged. Not swollen.     Left Turbinates: Enlarged. Not swollen.     Right Sinus: No maxillary sinus tenderness or frontal sinus tenderness.     Left Sinus: No maxillary sinus tenderness or frontal sinus tenderness.     Mouth/Throat:     Lips: Pink.     Mouth: Mucous membranes are moist.     Pharynx: Uvula midline. Posterior oropharyngeal erythema present. No uvula swelling.     Tonsils: No tonsillar exudate. 1+ on the right. 1+ on the left.  Eyes:     Extraocular Movements: Extraocular movements intact.     Pupils: Pupils are equal, round, and reactive to light.  Cardiovascular:     Rate and Rhythm: Normal rate and regular rhythm.     Pulses: Normal pulses.     Heart sounds: Normal heart sounds.  Pulmonary:     Effort: Pulmonary effort is normal.     Breath sounds: Normal breath sounds.  Abdominal:     General: Bowel sounds are normal.     Palpations: Abdomen is soft.  Musculoskeletal:     Cervical back: Normal range of motion.  Lymphadenopathy:     Cervical: No cervical adenopathy.  Skin:    General: Skin is warm and dry.     Capillary Refill: Capillary refill takes less than 2 seconds.  Neurological:     General: No focal deficit present.     Mental Status: He is alert and oriented to person, place, and time.  Psychiatric:        Mood and Affect: Mood normal.        Behavior: Behavior normal.      UC Treatments / Results  Labs (all labs ordered are listed, but only abnormal results are displayed) Labs Reviewed  CULTURE, GROUP A STREP [facility])  POCT RAPID STREP A (OFFICE)    EKG   Radiology No results found.  Procedures Procedures (including critical care time)  Medications Ordered in UC Medications - No data to display  Initial Impression / Assessment and Plan / UC Course  I  have reviewed the triage vital signs and  the nursing notes.  Pertinent labs & imaging results that were available during my care of the patient were reviewed by me and considered in my medical decision making (see chart for details).  Patient presents with sore throat that has been present for the past 2 days.  On exam, vital signs are stable, exam is benign.  Rapid strep test is negative, throat culture is pending.  Will provide symptomatic treatment with viscous lidocaine for his sore throat.  Patient advised to continue the use of ibuprofen to help with his throat pain.  Also provided supportive care recommendations for the patient.  Patient informs he will continue to follow-up with ophthalmology for his eye symptoms.  Patient advised to follow-up if symptoms do not improve within the next 7 to 10 days. Final Clinical Impressions(s) / UC Diagnoses   Final diagnoses:  Acute pharyngitis, unspecified etiology     Discharge Instructions      The rapid strep test is negative.  A throat culture is pending.  If the results are positive, you will be contacted and provided treatment. Continue use of ibuprofen to help with throat pain and inflammation.  Take medication with food and water to protect the stomach lining. Warm salt water gargles 3-4 times daily to help with throat pain or discomfort. Follow-up within the next 7 to 10 days for any worsening of symptoms or if symptoms do not improve.     ED Prescriptions     Medication Sig Dispense Auth. Provider   lidocaine (XYLOCAINE) 2 % solution Use as directed 10 mLs in the mouth or throat every 6 (six) hours as needed for mouth pain. 100 mL Gittel Mccamish-Warren, Alda Lea, NP      PDMP not reviewed this encounter.   Tish Men, NP 10/03/21 1743    Tish Men, NP 10/03/21 1744

## 2021-10-03 NOTE — ED Triage Notes (Signed)
Pt presents with eye redness. Reports leaving his contacts in for 21 days. Reports taking an eye antibiotic for it. Eyes are still red. Pt now is having a sore throat since saturday.

## 2021-10-03 NOTE — Discharge Instructions (Addendum)
The rapid strep test is negative.  A throat culture is pending.  If the results are positive, you will be contacted and provided treatment. Continue use of ibuprofen to help with throat pain and inflammation.  Take medication with food and water to protect the stomach lining. Warm salt water gargles 3-4 times daily to help with throat pain or discomfort. Follow-up within the next 7 to 10 days for any worsening of symptoms or if symptoms do not improve.

## 2021-10-06 LAB — CULTURE, GROUP A STREP (THRC)

## 2022-10-03 ENCOUNTER — Encounter: Payer: Self-pay | Admitting: Internal Medicine

## 2022-10-03 ENCOUNTER — Ambulatory Visit: Payer: BC Managed Care – PPO | Admitting: Internal Medicine

## 2022-10-03 VITALS — BP 113/69 | HR 62 | Ht 69.75 in | Wt 244.6 lb

## 2022-10-03 DIAGNOSIS — Z5320 Procedure and treatment not carried out because of patient's decision for unspecified reasons: Secondary | ICD-10-CM

## 2022-10-03 DIAGNOSIS — E1169 Type 2 diabetes mellitus with other specified complication: Secondary | ICD-10-CM | POA: Diagnosis not present

## 2022-10-03 DIAGNOSIS — Z1329 Encounter for screening for other suspected endocrine disorder: Secondary | ICD-10-CM

## 2022-10-03 DIAGNOSIS — Z1159 Encounter for screening for other viral diseases: Secondary | ICD-10-CM | POA: Diagnosis not present

## 2022-10-03 DIAGNOSIS — E559 Vitamin D deficiency, unspecified: Secondary | ICD-10-CM | POA: Diagnosis not present

## 2022-10-03 DIAGNOSIS — E119 Type 2 diabetes mellitus without complications: Secondary | ICD-10-CM

## 2022-10-03 DIAGNOSIS — E669 Obesity, unspecified: Secondary | ICD-10-CM

## 2022-10-03 DIAGNOSIS — Z8639 Personal history of other endocrine, nutritional and metabolic disease: Secondary | ICD-10-CM | POA: Insufficient documentation

## 2022-10-03 DIAGNOSIS — E785 Hyperlipidemia, unspecified: Secondary | ICD-10-CM

## 2022-10-03 NOTE — Progress Notes (Signed)
New Patient Office Visit  Subjective    Patient ID: Jose Bailey, male    DOB: [birth year]  Age: 37 y.o. MRN: [id number]  CC:  Chief Complaint  Patient presents with   Establish Care    HPI Jose Bailey presents to establish care.  He is a 37 year old male with a past medical history significant for T2DM, hypogonadism, hyperlipidemia, and vitamin D deficiency.  Previously followed by Dr. Iris Pert with [facility] in [place].  Jose Bailey reports feeling well today.  He is asymptomatic and has no acute concerns to discuss aside from desiring to establish care.  He currently works as a Psychiatrist.  Denies tobacco and illicit drug use, but endorses drinking 1-2 alcoholic beverages per week.  His family medical history is significant for diabetes mellitus and hypertension.  Chronic medical conditions and outstanding preventative care items discussed today are individually addressed in A/P below.   Outpatient Encounter Medications as of 10/03/2022  Medication Sig   [DISCONTINUED] blood glucose meter kit and supplies Dispense based on patient and insurance preference. Use up to four times daily as directed. (FOR ICD-10 E10.9, E11.9). (Patient not taking: Reported on 10/03/2022)   [DISCONTINUED] cyanocobalamin (,VITAMIN B-12,) 1000 MCG/ML injection Inject 1 mL into the skin every 30 (thirty) days. (Patient not taking: Reported on 10/03/2022)   [DISCONTINUED] insulin aspart (NOVOLOG FLEXPEN) 100 UNIT/ML FlexPen Inject 18 Units into the skin 3 (three) times daily with meals. (Patient not taking: Reported on 10/03/2022)   [DISCONTINUED] Insulin Glargine (LANTUS SOLOSTAR) 100 UNIT/ML Solostar Pen Inject 60 Units into the skin daily. (Patient not taking: Reported on 10/03/2022)   [DISCONTINUED] Insulin Pen Needle 31G X 5 MM MISC 1 Device by Does not apply route as directed. (Patient not taking: Reported on 10/03/2022)   [DISCONTINUED] lidocaine (XYLOCAINE) 2 % solution Use  as directed 10 mLs in the mouth or throat every 6 (six) hours as needed for mouth pain. (Patient not taking: Reported on 10/03/2022)   [DISCONTINUED] metFORMIN (GLUCOPHAGE XR) 500 MG 24 hr tablet Take 1 po BID with meals (Patient not taking: Reported on 10/03/2022)   [DISCONTINUED] pantoprazole (PROTONIX) 40 MG tablet Take 1 tablet (40 mg total) by mouth daily at 6 (six) AM. (Patient not taking: Reported on 10/03/2022)   No facility-administered encounter medications on file as of 10/03/2022.    Past Medical History:  Diagnosis Date   Diabetes mellitus without complication (HCC)     History reviewed. No pertinent surgical history.  Family History  Family history unknown: Yes    Social History   Socioeconomic History   Marital status: Married    Spouse name: Not on file   Number of children: Not on file   Years of education: Not on file   Highest education level: Not on file  Occupational History   Not on file  Tobacco Use   Smoking status: Every Day    Types: Cigars   Smokeless tobacco: Never   Tobacco comments:    monthly  Vaping Use   Vaping Use: Never used  Substance and Sexual Activity   Alcohol use: Yes    Comment: weekends   Drug use: Never   Sexual activity: Not on file  Other Topics Concern   Not on file  Social History Narrative   Not on file   Social Determinants of Health   Financial Resource Strain: Not on file  Food Insecurity: Not on file  Transportation Needs: Not on file  Physical Activity:  Not on file  Stress: Not on file  Social Connections: Not on file  Intimate Partner Violence: Not on file   Review of Systems  Constitutional:  Negative for chills and fever.  HENT:  Negative for sore throat.   Respiratory:  Negative for cough and shortness of breath.   Cardiovascular:  Negative for chest pain, palpitations and leg swelling.  Gastrointestinal:  Negative for abdominal pain, blood in stool, constipation, diarrhea, nausea and vomiting.   Genitourinary:  Negative for dysuria and hematuria.  Musculoskeletal:  Negative for myalgias.  Skin:  Negative for itching and rash.  Neurological:  Negative for dizziness and headaches.  Psychiatric/Behavioral:  Negative for depression and suicidal ideas.    Objective    BP 113/69   Pulse 62   Ht 5' 9.75" (1.772 m)   Wt 244 lb 9.6 oz (110.9 kg)   SpO2 97%   BMI 35.35 kg/m   Physical Exam Vitals reviewed.  Constitutional:      General: He is not in acute distress.    Appearance: Normal appearance. He is obese. He is not ill-appearing.  HENT:     Head: Normocephalic and atraumatic.     Right Ear: External ear normal.     Left Ear: External ear normal.     Nose: Nose normal. No congestion or rhinorrhea.     Mouth/Throat:     Mouth: Mucous membranes are moist.     Pharynx: Oropharynx is clear.  Eyes:     General: No scleral icterus.    Extraocular Movements: Extraocular movements intact.     Conjunctiva/sclera: Conjunctivae normal.     Pupils: Pupils are equal, round, and reactive to light.  Cardiovascular:     Rate and Rhythm: Normal rate and regular rhythm.     Pulses: Normal pulses.     Heart sounds: Normal heart sounds. No murmur heard. Pulmonary:     Effort: Pulmonary effort is normal.     Breath sounds: Normal breath sounds. No wheezing, rhonchi or rales.  Abdominal:     General: Abdomen is flat. Bowel sounds are normal. There is no distension.     Palpations: Abdomen is soft.     Tenderness: There is no abdominal tenderness.  Musculoskeletal:        General: No swelling or deformity. Normal range of motion.     Cervical back: Normal range of motion.  Skin:    General: Skin is warm and dry.     Capillary Refill: Capillary refill takes less than 2 seconds.  Neurological:     General: No focal deficit present.     Mental Status: He is alert and oriented to person, place, and time.     Motor: No weakness.  Psychiatric:        Mood and Affect: Mood normal.         Behavior: Behavior normal.        Thought Content: Thought content normal.    Diabetic foot exam was performed.  No deformities or other abnormal visual findings.  Posterior tibialis and dorsalis pulse intact bilaterally.  Intact to touch and monofilament testing bilaterally.   Assessment & Plan:   Problem List Items Addressed This Visit       Type 2 diabetes mellitus without complications (HCC) - Primary    A1c 5.6 as of March, 6.6 previously in August.  He is not taking any medications currently and has made significant lifestyle changes in an effort to lose weight and improve his  A1c. -No medication changes today.  Repeat A1c has been ordered -Diabetic foot exam completed today -Urine microalbumin/creatinine ratio is up-to-date -He will follow-up with his optometrist in August -I reviewed with Mr. Jarnigan the indications for ACE/ARB and statin therapy in the setting of diabetes.  He is aware of these recommendations, but has declined to start treatment for now.      Hyperlipidemia associated with type 2 diabetes mellitus (HCC)    LDL improved to 76 on labs from March.  Atorvastatin 10 mg daily has previously been prescribed in the setting of diabetes mellitus, however he is not taking this medication currently and declines additional statin therapy for now. -Repeat lipid panel ordered today      History of hypogonadism    Previously documented history of hypogonadism.  Asymptomatic currently and not on testosterone replacement therapy.      History of vitamin D deficiency    Repeat vitamin D level ordered today      Return in about 6 months (around 04/04/2023).   Billie Lade, MD

## 2022-10-03 NOTE — Assessment & Plan Note (Addendum)
A1c 5.6 as of March, 6.6 previously in August.  He is not taking any medications currently and has made significant lifestyle changes in an effort to lose weight and improve his A1c. -No medication changes today.  Repeat A1c has been ordered -Diabetic foot exam completed today -Urine microalbumin/creatinine ratio is up-to-date -He will follow-up with his optometrist in August -I reviewed with Jose Bailey the indications for ACE/ARB and statin therapy in the setting of diabetes.  He is aware of these recommendations, but has declined to start treatment for now.

## 2022-10-03 NOTE — Patient Instructions (Signed)
It was a pleasure to see you today.  Thank you for giving [place] the opportunity to be involved in your care.  Below is a brief recap of your visit and next steps.  We will plan to see you again in 6 months.  Summary You have established care today. Repeat labs have been ordered We will follow up in 6 months

## 2022-10-03 NOTE — Assessment & Plan Note (Signed)
LDL improved to 76 on labs from March.  Atorvastatin 10 mg daily has previously been prescribed in the setting of diabetes mellitus, however he is not taking this medication currently and declines additional statin therapy for now. -Repeat lipid panel ordered today

## 2022-10-03 NOTE — Assessment & Plan Note (Addendum)
Previously documented history of hypogonadism.  Asymptomatic currently and not on testosterone replacement therapy.

## 2022-10-03 NOTE — Assessment & Plan Note (Signed)
Repeat vitamin D level ordered today 

## 2022-10-04 LAB — CMP14+EGFR
ALT: 44 IU/L (ref 0–44)
AST: 39 IU/L (ref 0–40)
Albumin: 4.8 g/dL (ref 4.1–5.1)
Alkaline Phosphatase: 50 IU/L (ref 44–121)
BUN/Creatinine Ratio: 11 (ref 9–20)
BUN: 15 mg/dL (ref 6–20)
Bilirubin Total: 0.6 mg/dL (ref 0.0–1.2)
CO2: 26 mmol/L (ref 20–29)
Calcium: 10.4 mg/dL — ABNORMAL HIGH (ref 8.7–10.2)
Chloride: 101 mmol/L (ref 96–106)
Creatinine, Ser: 1.36 mg/dL — ABNORMAL HIGH (ref 0.76–1.27)
Globulin, Total: 2.7 g/dL (ref 1.5–4.5)
Glucose: 101 mg/dL — ABNORMAL HIGH (ref 70–99)
Potassium: 4.7 mmol/L (ref 3.5–5.2)
Sodium: 140 mmol/L (ref 134–144)
Total Protein: 7.5 g/dL (ref 6.0–8.5)
eGFR: 70 mL/min/{1.73_m2} (ref >59)

## 2022-10-04 LAB — TSH+FREE T4
Free T4: 1.11 ng/dL (ref 0.82–1.77)
TSH: 1.02 u[IU]/mL (ref 0.450–4.500)

## 2022-10-04 LAB — CBC WITH DIFFERENTIAL/PLATELET
Basophils Absolute: 0 10*3/uL (ref 0.0–0.2)
Basos: 0 %
EOS (ABSOLUTE): 0.1 10*3/uL (ref 0.0–0.4)
Eos: 1 %
Hematocrit: 44.9 % (ref 37.5–51.0)
Hemoglobin: 15 g/dL (ref 13.0–17.7)
Immature Grans (Abs): 0 10*3/uL (ref 0.0–0.1)
Immature Granulocytes: 0 %
Lymphocytes Absolute: 2.9 10*3/uL (ref 0.7–3.1)
Lymphs: 39 %
MCH: 27.9 pg (ref 26.6–33.0)
MCHC: 33.4 g/dL (ref 31.5–35.7)
MCV: 84 fL (ref 79–97)
Monocytes Absolute: 0.6 10*3/uL (ref 0.1–0.9)
Monocytes: 8 %
Neutrophils Absolute: 3.9 10*3/uL (ref 1.4–7.0)
Neutrophils: 52 %
Platelets: 287 10*3/uL (ref 150–450)
RBC: 5.37 x10E6/uL (ref 4.14–5.80)
RDW: 12.9 % (ref 11.6–15.4)
WBC: 7.6 10*3/uL (ref 3.4–10.8)

## 2022-10-04 LAB — HEMOGLOBIN A1C
Est. average glucose Bld gHb Est-mCnc: 126 mg/dL
Hgb A1c MFr Bld: 6 % — ABNORMAL HIGH (ref 4.8–5.6)

## 2022-10-04 LAB — LIPID PANEL
Chol/HDL Ratio: 3.7 ratio (ref 0.0–5.0)
Cholesterol, Total: 146 mg/dL (ref 100–199)
HDL: 39 mg/dL — ABNORMAL LOW (ref >39)
LDL Chol Calc (NIH): 92 mg/dL (ref 0–99)
Triglycerides: 79 mg/dL (ref 0–149)
VLDL Cholesterol Cal: 15 mg/dL (ref 5–40)

## 2022-10-04 LAB — HCV INTERPRETATION

## 2022-10-04 LAB — HCV AB W REFLEX TO QUANT PCR: HCV Ab: NONREACTIVE

## 2022-10-04 LAB — B12 AND FOLATE PANEL
Folate: 16.7 ng/mL (ref >3.0)
Vitamin B-12: 1185 pg/mL (ref 232–1245)

## 2022-10-04 LAB — VITAMIN D 25 HYDROXY (VIT D DEFICIENCY, FRACTURES): Vit D, 25-Hydroxy: 40.8 ng/mL (ref 30.0–100.0)

## 2023-03-03 ENCOUNTER — Ambulatory Visit: Payer: Self-pay | Admitting: Internal Medicine

## 2023-03-03 DIAGNOSIS — E119 Type 2 diabetes mellitus without complications: Secondary | ICD-10-CM

## 2023-03-03 NOTE — Telephone Encounter (Signed)
Copied from CRM [id number]. Topic: Clinical - Medication Refill >> Mar 03, 2023  2:41 PM Orinda Kenner C wrote: Most Recent Primary Care Visit:  Provider: Christel Mormon E  Department: [facility]  Visit Type: NEW PATIENT  Date: 10/03/2022  Medication: Completely out of insulin checker. Insulin machine and strips to be compatible with phone, Android Galaxy 23 or 24.   Has the patient contacted their pharmacy? No  (Agent: If no, request that the patient contact the pharmacy for the refill. If patient does not wish to contact the pharmacy document the reason why and proceed with request.) (Agent: If yes, when and what did the pharmacy advise?)  Is this the correct pharmacy for this prescription?  If no, delete pharmacy and type the correct one.  This is the patient's preferred pharmacy:  [facility] [id number] - [place], [place] - [address] AT [facility] [address] [place] [address] Phone: [phone] Fax: [phone]   Has the prescription been filled recently? no  Is the patient out of the medication? yes  Has the patient been seen for an appointment in the last year OR does the patient have an upcoming appointment? yes  Can we respond through MyChart? no  Agent: Please be advised that Rx refills may take up to 3 business days. We ask that you follow-up with your pharmacy.   Chief Complaint: Needs script for diabetes maintenance supplies Pertinent Negatives: Patient denies acuity of diabetes. Disposition: [] ED /[] Urgent Care (no appt availability in office) / [] Appointment(In office/virtual)/ []  [facility]/ [] Home Care/ [] Refused Recommended Disposition /[] [facility]/ [x]  Follow-up with PCP Additional Notes: Pt requesting new machine and strips that are compatible with phone.   Answer Assessment - Initial Assessment Questions 1. REASON FOR CALL or QUESTION: "What is your reason for calling today?" or "How  can I best help you?" or "What question do you have that I can help answer?"     Pt needs a script for a new glucometer and more test strips. Pt has used Flex OneTouch for 4 years and needs new machine. Pt takes metformin "morning and night," runs 5k daily then exercises for 1 hr daily, needs the glucometer "just to maintain" good blood sugar levels. Pt's preferred pharmacy is [facility] on [address] in [place].  Protocols used: Information Only Call - No Triage-A-AH

## 2023-03-04 MED ORDER — LANCET DEVICE MISC: 1.0000 | Freq: Three times a day (TID) | 0 refills | Status: AC

## 2023-03-04 MED ORDER — BLOOD GLUCOSE TEST VI STRP: 1.0000 | ORAL_STRIP | Freq: Three times a day (TID) | 0 refills | Status: AC

## 2023-03-04 MED ORDER — LANCETS MISC. MISC: 1.0000 | Freq: Three times a day (TID) | 0 refills | Status: AC

## 2023-03-04 MED ORDER — BLOOD GLUCOSE MONITORING SUPPL DEVI: 1.0000 | Freq: Three times a day (TID) | 0 refills | Status: DC

## 2023-03-04 NOTE — Addendum Note (Signed)
Addended by: Christel Mormon E on: 03/04/2023 11:07 AM   Modules accepted: Orders

## 2023-03-04 NOTE — Telephone Encounter (Signed)
Pt is asking for glucometer and states he is taking metformin, does not need a refill on the medication right now.

## 2023-04-04 ENCOUNTER — Ambulatory Visit: Payer: BC Managed Care – PPO | Admitting: Internal Medicine

## 2023-04-04 ENCOUNTER — Encounter: Payer: Self-pay | Admitting: Internal Medicine

## 2023-04-04 VITALS — BP 125/66 | HR 51 | Ht 69.0 in | Wt 249.8 lb

## 2023-04-04 DIAGNOSIS — E785 Hyperlipidemia, unspecified: Secondary | ICD-10-CM

## 2023-04-04 DIAGNOSIS — E119 Type 2 diabetes mellitus without complications: Secondary | ICD-10-CM

## 2023-04-04 DIAGNOSIS — E1169 Type 2 diabetes mellitus with other specified complication: Secondary | ICD-10-CM

## 2023-04-04 NOTE — Assessment & Plan Note (Signed)
LDL 92 on labs from June.  He was previously prescribed atorvastatin but is not taking it due to concern for adverse effects.  We reviewed indications for statin therapy in the setting of diabetes mellitus but he declines starting medication for now.

## 2023-04-04 NOTE — Patient Instructions (Signed)
It was a pleasure to see you today.  Thank you for giving [place] the opportunity to be involved in your care.  Below is a brief recap of your visit and next steps.  We will plan to see you again in 6 months.  Summary No medication changes today Repeat labs and urine study ordered Medical nutrition therapy referral placed Follow up in 6 months

## 2023-04-04 NOTE — Progress Notes (Signed)
Established Patient Office Visit  Subjective   Patient ID: Jose Bailey, male    DOB: 01/24/1986  Age: 37 y.o. MRN: [id number]  Chief Complaint  Patient presents with   Diabetes    Six month follow up   [name] returns to care today for routine follow-up.  He was last evaluated by me on 6/27 as a new patient presenting to establish care.  No medication changes were made that time, basic labs were ordered, and 89-month follow-up was arranged.  There have been no acute interval events.  [name] reports feeling well today.  His acute concern is wanting to speak with someone to discuss dietary changes in an effort to maintain adequate control of diabetes.   Past Medical History:  Diagnosis Date   Diabetes mellitus without complication (HCC)    History reviewed. No pertinent surgical history. Social History   Tobacco Use   Smoking status: Every Day    Types: Cigars   Smokeless tobacco: Never   Tobacco comments:    monthly  Vaping Use   Vaping status: Never Used  Substance Use Topics   Alcohol use: Yes    Comment: weekends   Drug use: Never   Family History  Family history unknown: Yes   Allergies  Allergen Reactions   Penicillins Hives    .Did it involve swelling of the face/tongue/throat, SOB, or low BP? No Did it involve sudden or severe rash/hives, skin peeling, or any reaction on the inside of your mouth or nose? Yes Did you need to seek medical attention at a hospital or doctor's office? Yes When did it last happen?      Childhood If all above answers are "NO", may proceed with cephalosporin use.    Review of Systems  Constitutional:  Negative for chills and fever.  HENT:  Negative for sore throat.   Respiratory:  Negative for cough and shortness of breath.   Cardiovascular:  Negative for chest pain, palpitations and leg swelling.  Gastrointestinal:  Negative for abdominal pain, blood in stool, constipation, diarrhea, nausea and vomiting.   Genitourinary:  Negative for dysuria and hematuria.  Musculoskeletal:  Negative for myalgias.  Skin:  Negative for itching and rash.  Neurological:  Negative for dizziness and headaches.  Psychiatric/Behavioral:  Negative for depression and suicidal ideas.      Objective:     BP 125/66 (BP Location: Left Arm, Patient Position: Sitting, Cuff Size: Large)   Pulse (!) 51   Ht 5\' 9"  (1.753 m)   Wt 249 lb 12.8 oz (113.3 kg)   SpO2 98%   BMI 36.89 kg/m  BP Readings from Last 3 Encounters:  04/04/23 125/66  10/03/22 113/69  10/03/21 127/84   Physical Exam Vitals reviewed.  Constitutional:      General: He is not in acute distress.    Appearance: Normal appearance. He is obese. He is not ill-appearing.  HENT:     Head: Normocephalic and atraumatic.     Right Ear: External ear normal.     Left Ear: External ear normal.     Nose: Nose normal. No congestion or rhinorrhea.     Mouth/Throat:     Mouth: Mucous membranes are moist.     Pharynx: Oropharynx is clear.  Eyes:     General: No scleral icterus.    Extraocular Movements: Extraocular movements intact.     Conjunctiva/sclera: Conjunctivae normal.     Pupils: Pupils are equal, round, and reactive to light.  Cardiovascular:  Rate and Rhythm: Normal rate and regular rhythm.     Pulses: Normal pulses.     Heart sounds: Normal heart sounds. No murmur heard. Pulmonary:     Effort: Pulmonary effort is normal.     Breath sounds: Normal breath sounds. No wheezing, rhonchi or rales.  Abdominal:     General: Abdomen is flat. Bowel sounds are normal. There is no distension.     Palpations: Abdomen is soft.     Tenderness: There is no abdominal tenderness.  Musculoskeletal:        General: No swelling or deformity. Normal range of motion.     Cervical back: Normal range of motion.  Skin:    General: Skin is warm and dry.     Capillary Refill: Capillary refill takes less than 2 seconds.  Neurological:     General: No focal  deficit present.     Mental Status: He is alert and oriented to person, place, and time.     Motor: No weakness.  Psychiatric:        Mood and Affect: Mood normal.        Behavior: Behavior normal.        Thought Content: Thought content normal.   Last CBC Lab Results  Component Value Date   WBC 7.6 10/03/2022   HGB 15.0 10/03/2022   HCT 44.9 10/03/2022   MCV 84 10/03/2022   MCH 27.9 10/03/2022   RDW 12.9 10/03/2022   PLT 287 10/03/2022   Last metabolic panel Lab Results  Component Value Date   GLUCOSE 101 (H) 10/03/2022   NA 140 10/03/2022   K 4.7 10/03/2022   CL 101 10/03/2022   CO2 26 10/03/2022   BUN 15 10/03/2022   CREATININE 1.36 (H) 10/03/2022   EGFR 70 10/03/2022   CALCIUM 10.4 (H) 10/03/2022   PHOS 2.6 11/13/2018   PROT 7.5 10/03/2022   ALBUMIN 4.8 10/03/2022   LABGLOB 2.7 10/03/2022   BILITOT 0.6 10/03/2022   ALKPHOS 50 10/03/2022   AST 39 10/03/2022   ALT 44 10/03/2022   ANIONGAP 9 11/13/2018   Last lipids Lab Results  Component Value Date   CHOL 146 10/03/2022   HDL 39 (L) 10/03/2022   LDLCALC 92 10/03/2022   TRIG 79 10/03/2022   CHOLHDL 3.7 10/03/2022   Last hemoglobin A1c Lab Results  Component Value Date   HGBA1C 6.0 (H) 10/03/2022   Last thyroid functions Lab Results  Component Value Date   TSH 1.020 10/03/2022   Last vitamin D Lab Results  Component Value Date   VD25OH 40.8 10/03/2022   Last vitamin B12 and Folate Lab Results  Component Value Date   VITAMINB12 1,185 10/03/2022   FOLATE 16.7 10/03/2022     Assessment & Plan:   Problem List Items Addressed This Visit       Type 2 diabetes mellitus without complications (HCC) - Primary   A1c 6.0 on labs from June.  Diet controlled.  Previously prescribed metformin and Toujeo but is not taking any medications currently.  Today he expresses interest in speaking with someone to discuss dietary changes in an effort to maintain adequate control of diabetes with diet  alone. -Repeat A1c and urine microalbumin/creatinine ratio ordered today -Medical nutrition therapy referral placed at patient's request -We again reviewed indications for ACE/ARB and statin therapy but he has declined both medications      Hyperlipidemia associated with type 2 diabetes mellitus (HCC)   LDL 92 on labs from June.  He was previously prescribed atorvastatin but is not taking it due to concern for adverse effects.  We reviewed indications for statin therapy in the setting of diabetes mellitus but he declines starting medication for now.       Return in about 6 months (around 10/03/2023).    Billie Lade, MD

## 2023-04-04 NOTE — Assessment & Plan Note (Addendum)
A1c 6.0 on labs from June.  Diet controlled.  Previously prescribed metformin and Toujeo but is not taking any medications currently.  Today he expresses interest in speaking with someone to discuss dietary changes in an effort to maintain adequate control of diabetes with diet alone. -Repeat A1c and urine microalbumin/creatinine ratio ordered today -Medical nutrition therapy referral placed at patient's request -We again reviewed indications for ACE/ARB and statin therapy but he has declined both medications

## 2023-04-06 LAB — BASIC METABOLIC PANEL
BUN/Creatinine Ratio: 13 (ref 9–20)
BUN: 18 mg/dL (ref 6–20)
CO2: 24 mmol/L (ref 20–29)
Calcium: 10 mg/dL (ref 8.7–10.2)
Chloride: 102 mmol/L (ref 96–106)
Creatinine, Ser: 1.34 mg/dL — ABNORMAL HIGH (ref 0.76–1.27)
Glucose: 117 mg/dL — ABNORMAL HIGH (ref 70–99)
Potassium: 4.4 mmol/L (ref 3.5–5.2)
Sodium: 141 mmol/L (ref 134–144)
eGFR: 70 mL/min/{1.73_m2} (ref >59)

## 2023-04-06 LAB — MICROALBUMIN / CREATININE URINE RATIO: Creatinine, Urine: 177.6 mg/dL

## 2023-04-06 LAB — LIPID PANEL
Chol/HDL Ratio: 4.5 {ratio} (ref 0.0–5.0)
Cholesterol, Total: 162 mg/dL (ref 100–199)
HDL: 36 mg/dL — ABNORMAL LOW (ref >39)
LDL Chol Calc (NIH): 111 mg/dL — ABNORMAL HIGH (ref 0–99)
Triglycerides: 79 mg/dL (ref 0–149)
VLDL Cholesterol Cal: 15 mg/dL (ref 5–40)

## 2023-04-06 LAB — HEMOGLOBIN A1C
Est. average glucose Bld gHb Est-mCnc: 128 mg/dL
Hgb A1c MFr Bld: 6.1 % — ABNORMAL HIGH (ref 4.8–5.6)

## 2023-05-19 ENCOUNTER — Ambulatory Visit: Payer: BC Managed Care – PPO | Admitting: Nutrition

## 2023-09-05 ENCOUNTER — Ambulatory Visit

## 2023-09-05 VITALS — BP 129/71 | HR 60 | Ht 68.0 in | Wt 250.1 lb

## 2023-09-05 DIAGNOSIS — Z7984 Long term (current) use of oral hypoglycemic drugs: Secondary | ICD-10-CM

## 2023-09-05 DIAGNOSIS — E1169 Type 2 diabetes mellitus with other specified complication: Secondary | ICD-10-CM

## 2023-09-05 DIAGNOSIS — E785 Hyperlipidemia, unspecified: Secondary | ICD-10-CM

## 2023-09-05 DIAGNOSIS — E559 Vitamin D deficiency, unspecified: Secondary | ICD-10-CM | POA: Diagnosis not present

## 2023-09-05 DIAGNOSIS — E119 Type 2 diabetes mellitus without complications: Secondary | ICD-10-CM

## 2023-09-05 MED ORDER — METFORMIN HCL 500 MG PO TABS
500.0000 mg | ORAL_TABLET | Freq: Two times a day (BID) | ORAL | 2 refills | Status: DC
Start: 2023-09-05 — End: 2023-09-10

## 2023-09-05 MED ORDER — DEXCOM G7 SENSOR MISC: 5 refills | Status: DC

## 2023-09-05 NOTE — Assessment & Plan Note (Signed)
 Recheck levels.  Not currently taking.  Follow-up according to results.

## 2023-09-05 NOTE — Progress Notes (Signed)
 Established Patient Office Visit  Subjective   Patient ID: Jose Bailey, male    DOB: 07/08/1985  Age: 37 y.o. MRN: [id number]  Chief Complaint  Patient presents with   Medical Management of Chronic Issues    Pt states "Here for 6 month follow up also requesting a continuous blood glucose monitor like freestyle libre and a new rx for his metformin "    HPI    ROS    Objective:     BP 129/71   Pulse 60   Ht 5\' 8"  (1.727 m)   Wt 250 lb 1.9 oz (113.5 kg)   SpO2 96%   BMI 38.03 kg/m    Physical Exam Vitals and nursing note reviewed.  Constitutional:      Appearance: Normal appearance.  HENT:     Head: Normocephalic.     Right Ear: Tympanic membrane, ear canal and external ear normal.     Left Ear: Tympanic membrane, ear canal and external ear normal.     Nose: Nose normal.     Mouth/Throat:     Mouth: Mucous membranes are moist.     Pharynx: Oropharynx is clear.  Cardiovascular:     Rate and Rhythm: Normal rate and regular rhythm.     Pulses:          Dorsalis pedis pulses are 2+ on the right side and 2+ on the left side.       Posterior tibial pulses are 1+ on the right side and 1+ on the left side.  Pulmonary:     Effort: Pulmonary effort is normal.     Breath sounds: Normal breath sounds.  Musculoskeletal:     Cervical back: Normal range of motion and neck supple.  Feet:     Right foot:     Protective Sensation: 6 sites tested.  6 sites sensed.     Skin integrity: Skin integrity normal.     Toenail Condition: Right toenails are normal.     Left foot:     Protective Sensation: 6 sites tested.  6 sites sensed.     Skin integrity: Skin integrity normal.     Toenail Condition: Left toenails are normal.  Skin:    General: Skin is warm and dry.  Neurological:     Mental Status: He is alert and oriented to person, place, and time.  Psychiatric:        Mood and Affect: Mood normal.        Thought Content: Thought content normal.     No results  found for any visits on 09/05/23.    The ASCVD Risk score (Arnett DK, et al., 2019) failed to calculate for the following reasons:   The 2019 ASCVD risk score is only valid for ages 68 to 77    Assessment & Plan:   Problem List Items Addressed This Visit       Endocrine   Type 2 diabetes mellitus without complications (HCC) - Primary   A1c 6.1 on labs from December.  He does report an increase in blood sugar levels.  He states he has a newborn baby at home and a toddler, therefore is hard for him to do meal planning and eat healthy at this time.  He is exercising daily, but would like to restart metformin  at this time.  He has a history of DKA and was on mealtime and long-acting insulin  in the past.  Agreed to refilling metformin  and adding CGM so that he can  closely monitor blood sugar levels.   -Repeat A1c and urine microalbumin/creatinine ratio ordered today      Relevant Medications   Continuous Glucose Sensor (DEXCOM G7 SENSOR) MISC   metFORMIN  (GLUCOPHAGE ) 500 MG tablet   Other Relevant Orders   HgB A1c   Urine Microalbumin w/creat. ratio   B12   HM Diabetes Foot Exam (Completed)   Hyperlipidemia associated with type 2 diabetes mellitus (HCC)   He will return for fasting lipid panel.  He has declined statin open follow-up according to lab results.  Recommend low-fat diet and continued exercise to help control.      Relevant Medications   metFORMIN  (GLUCOPHAGE ) 500 MG tablet   Other Relevant Orders   CMP14+EGFR   Lipid panel     Other   Vitamin D  deficiency   Recheck levels.  Not currently taking.  Follow-up according to results.      Relevant Orders   Vitamin D  (25 hydroxy)    No follow-ups on file.    Alison Irvine, FNP

## 2023-09-05 NOTE — Assessment & Plan Note (Signed)
 He will return for fasting lipid panel.  He has declined statin open follow-up according to lab results.  Recommend low-fat diet and continued exercise to help control.

## 2023-09-05 NOTE — Assessment & Plan Note (Signed)
 A1c 6.1 on labs from December.  He does report an increase in blood sugar levels.  He states he has a newborn baby at home and a toddler, therefore is hard for him to do meal planning and eat healthy at this time.  He is exercising daily, but would like to restart metformin  at this time.  He has a history of DKA and was on mealtime and long-acting insulin  in the past.  Agreed to refilling metformin  and adding CGM so that he can closely monitor blood sugar levels.   -Repeat A1c and urine microalbumin/creatinine ratio ordered today

## 2023-09-08 ENCOUNTER — Telehealth: Payer: Self-pay | Admitting: Pharmacy Technician

## 2023-09-08 ENCOUNTER — Other Ambulatory Visit (HOSPITAL_COMMUNITY): Payer: Self-pay

## 2023-09-08 NOTE — Telephone Encounter (Signed)
 Pharmacy Patient Advocate Encounter   Received notification from Onbase that prior authorization for Dexcom G7 Sensor is required/requested.   Insurance verification completed.   The patient is insured through [employer] .   Per test claim: PA required; PA submitted to above mentioned insurance via CoverMyMeds Key/confirmation #/EOC [id number] Status is pending

## 2023-09-09 NOTE — Telephone Encounter (Signed)
 Pharmacy Patient Advocate Encounter  Received notification from [facility] that Prior Authorization for Dexcom G7 Sensor has been DENIED.  Full denial letter will be uploaded to the media tab. See denial reason below.   PA #/Case ID/Reference #: [id number]

## 2023-09-10 ENCOUNTER — Other Ambulatory Visit (HOSPITAL_COMMUNITY): Payer: Self-pay

## 2023-09-10 ENCOUNTER — Other Ambulatory Visit: Payer: Self-pay

## 2023-09-10 DIAGNOSIS — E119 Type 2 diabetes mellitus without complications: Secondary | ICD-10-CM

## 2023-09-10 MED ORDER — BLOOD GLUCOSE MONITORING SUPPL DEVI: 1.0000 | Freq: Every day | 0 refills | Status: DC

## 2023-09-10 MED ORDER — METFORMIN HCL ER 500 MG PO TB24: 500.0000 mg | ORAL_TABLET | Freq: Two times a day (BID) | ORAL | 5 refills | Status: AC

## 2023-09-10 MED ORDER — BLOOD GLUCOSE TEST VI STRP
1.0000 | ORAL_STRIP | Freq: Every day | 11 refills | Status: AC
Start: 2023-09-10 — End: 2023-10-10

## 2023-09-10 MED ORDER — LANCETS MISC. MISC: 1.0000 | Freq: Every day | 11 refills | Status: AC

## 2023-09-10 MED ORDER — LANCET DEVICE MISC: 1.0000 | Freq: Every day | 0 refills | Status: AC

## 2023-09-13 LAB — HEMOGLOBIN A1C
Est. average glucose Bld gHb Est-mCnc: 131 mg/dL
Hgb A1c MFr Bld: 6.2 % — ABNORMAL HIGH (ref 4.8–5.6)

## 2023-09-13 LAB — VITAMIN D 25 HYDROXY (VIT D DEFICIENCY, FRACTURES): Vit D, 25-Hydroxy: 61.5 ng/mL (ref 30.0–100.0)

## 2023-09-13 LAB — VITAMIN B12: Vitamin B-12: 1888 pg/mL — ABNORMAL HIGH (ref 232–1245)

## 2023-09-15 ENCOUNTER — Ambulatory Visit: Payer: Self-pay

## 2023-10-03 ENCOUNTER — Ambulatory Visit

## 2023-10-03 ENCOUNTER — Ambulatory Visit: Payer: BC Managed Care – PPO | Admitting: Internal Medicine

## 2024-01-09 ENCOUNTER — Encounter

## 2024-03-26 NOTE — Progress Notes (Signed)
 Jose Bailey                                          MRN: [id number]   03/26/2024   The [facility]BCI Quality Team Specialist reviewed this patient medical record for the purposes of chart review for care gap closure. The following were reviewed: chart review for care gap closure-kidney health evaluation for diabetes:uACR.    VBCI Quality Team

## 2024-04-15 ENCOUNTER — Ambulatory Visit

## 2024-04-15 VITALS — BP 123/69 | HR 71 | Ht 68.0 in | Wt 252.1 lb

## 2024-04-15 DIAGNOSIS — Z0000 Encounter for general adult medical examination without abnormal findings: Secondary | ICD-10-CM

## 2024-04-15 DIAGNOSIS — E559 Vitamin D deficiency, unspecified: Secondary | ICD-10-CM | POA: Diagnosis not present

## 2024-04-15 DIAGNOSIS — E119 Type 2 diabetes mellitus without complications: Secondary | ICD-10-CM

## 2024-04-15 DIAGNOSIS — E1169 Type 2 diabetes mellitus with other specified complication: Secondary | ICD-10-CM | POA: Diagnosis not present

## 2024-04-15 DIAGNOSIS — E785 Hyperlipidemia, unspecified: Secondary | ICD-10-CM

## 2024-04-15 DIAGNOSIS — Z7984 Long term (current) use of oral hypoglycemic drugs: Secondary | ICD-10-CM | POA: Diagnosis not present

## 2024-04-15 DIAGNOSIS — Z0001 Encounter for general adult medical examination with abnormal findings: Secondary | ICD-10-CM | POA: Diagnosis not present

## 2024-04-15 MED ORDER — OZEMPIC (0.25 OR 0.5 MG/DOSE) 2 MG/3ML ~~LOC~~ SOPN: 0.2500 mg | PEN_INJECTOR | SUBCUTANEOUS | 5 refills | Status: AC

## 2024-04-15 NOTE — Progress Notes (Unsigned)
 "  Complete physical exam  Patient: Jose Bailey   DOB: 19-Jan-1986   38 y.o. Male  MRN: [id number]  Subjective:    Chief Complaint  Patient presents with   Annual Exam    Jose Bailey is a 39 y.o. male who presents today for a complete physical exam. He reports consuming a {diet types:17450} diet. {types:19826} He generally feels {DESC; WELL/FAIRLY WELL/POORLY:18703}. He reports sleeping {DESC; WELL/FAIRLY WELL/POORLY:18703}. He {does/does not:200015} have additional problems to discuss today.    Most recent fall risk assessment:    09/05/2023    1:45 PM  Fall Risk   Falls in the past year? 0  Number falls in past yr: 0  Injury with Fall? 0   Risk for fall due to : No Fall Risks  Follow up Falls evaluation completed     Data saved with a previous flowsheet row definition     Most recent depression screenings:    09/05/2023    1:45 PM 04/04/2023    8:04 AM  PHQ 2/9 Scores  PHQ - 2 Score 0 0  PHQ- 9 Score 0  0      Data saved with a previous flowsheet row definition    {VISON DENTAL STD PSA (Optional):27386}  Patient Active Problem List   Diagnosis Date Noted   Vitamin D  deficiency 09/05/2023   Type 2 diabetes mellitus without complications (HCC) 10/03/2022   Hyperlipidemia associated with type 2 diabetes mellitus (HCC) 10/03/2022   History of hypogonadism 10/03/2022   Hypogonadism in male 11/12/2018     Patient Care Team: Bevely Doffing, FNP as PCP - General (Family Medicine)   Show/hide medication list[1]  ROS     Objective:     BP 123/69   Pulse 71   Ht 5' 8 (1.727 m)   Wt 252 lb 1.9 oz (114.4 kg)   SpO2 96%   BMI 38.33 kg/m  BP Readings from Last 3 Encounters:  04/15/24 123/69  09/05/23 129/71  04/04/23 125/66   Wt Readings from Last 3 Encounters:  04/15/24 252 lb 1.9 oz (114.4 kg)  09/05/23 250 lb 1.9 oz (113.5 kg)  04/04/23 249 lb 12.8 oz (113.3 kg)     Physical Exam   {Perform Simple Foot Exam  Perform Detailed  exam:1} Diabetic foot exam was performed with the following findings:   No deformities, ulcerations, or other skin breakdown Normal sensation of 10g monofilament Intact posterior tibialis and dorsalis pedis pulses     No results found for any visits on 04/15/24. {Show previous labs (optional):23779}    Assessment & Plan:    Routine Health Maintenance and Physical Exam  Immunization History  Administered Date(s) Administered   DTaP 09/17/1988, 07/07/1990, 11/30/1990, 11/04/1991   Hepatitis B, PED/ADOLESCENT 02/15/1999, 03/22/1999, 06/21/1999   MMR 09/17/1988, 11/04/1991   OPV 09/17/1988, 07/07/1990, 11/04/1991   PFIZER(Purple Top)SARS-COV-2 Vaccination 06/13/2019, 07/04/2019   Td 11/08/2003   Tdap 11/29/2020   Varicella 08/22/2003, 06/06/2004    Health Maintenance  Topic Date Due   OPHTHALMOLOGY EXAM  Never done   Pneumococcal Vaccine (1 of 2 - PCV) Never done   HPV VACCINES (1 - 3-dose SCDM series) Never done   Influenza Vaccine  Never done   COVID-19 Vaccine (3 - 2025-26 season) 12/08/2023   HEMOGLOBIN A1C  03/12/2024   Diabetic kidney evaluation - eGFR measurement  04/03/2024   Diabetic kidney evaluation - Urine ACR  04/03/2024   FOOT EXAM  09/04/2024   DTaP/Tdap/Td (7 - Td or  Tdap) 11/30/2030   Hepatitis B Vaccines 19-59 Average Risk  Completed   Hepatitis C Screening  Completed   HIV Screening  Completed   Meningococcal B Vaccine  Aged Out    Discussed health benefits of physical activity, and encouraged him to engage in regular exercise appropriate for his age and condition.  Problem List Items Addressed This Visit   None  No follow-ups on file.     Leita Longs, FNP      [1]  Outpatient Medications Prior to Visit  Medication Sig   metFORMIN  (GLUCOPHAGE -XR) 500 MG 24 hr tablet Take 1 tablet (500 mg total) by mouth 2 (two) times daily with a meal.   Blood Glucose Monitoring Suppl DEVI 1 each by Does not apply route daily. May substitute to any  manufacturer covered by patient's insurance. (Patient not taking: Reported on 04/15/2024)   No facility-administered medications prior to visit.   "

## 2024-04-18 NOTE — Assessment & Plan Note (Signed)
 Update lipid panel toady.  Recommend a healthy low-fat diet and regular exercise to manage

## 2024-04-18 NOTE — Assessment & Plan Note (Signed)
 Recheck levels.  Not currently taking supplementation.  Recommend taking a vitamin D3 with K2.   Follow-up according to results.

## 2024-04-18 NOTE — Assessment & Plan Note (Signed)
 Managed with metformin . Cravings and dietary control issues persist. Previous Ozempic  use beneficial for weight and cravings. Interested in resuming Ozempic . Active lifestyle but struggles with diet. - Prescribed Ozempic  0.25 mg once weekly. - Continue metformin  extended release once daily. - Discussed potential insurance issues with Ozempic  dosing and alternatives. - Encouraged dietary modifications to reduce cravings, especially sweets. - Discussed benefits of working with a psychologist, educational for accountability and weight management.

## 2024-10-20 ENCOUNTER — Ambulatory Visit: Payer: Self-pay
